# Patient Record
Sex: Female | Born: 1960 | Race: White | Hispanic: No | Marital: Married | Smoking: Never smoker
Health system: Southern US, Community
[De-identification: ages and names within clinical notes are randomized; demographics above are authoritative.]

## PROBLEM LIST (undated history)

## (undated) DIAGNOSIS — Z9889 Other specified postprocedural states: Secondary | ICD-10-CM

## (undated) DIAGNOSIS — Z87442 Personal history of urinary calculi: Secondary | ICD-10-CM

## (undated) DIAGNOSIS — N3010 Interstitial cystitis (chronic) without hematuria: Secondary | ICD-10-CM

## (undated) DIAGNOSIS — M1990 Unspecified osteoarthritis, unspecified site: Secondary | ICD-10-CM

## (undated) DIAGNOSIS — N189 Chronic kidney disease, unspecified: Secondary | ICD-10-CM

## (undated) DIAGNOSIS — R112 Nausea with vomiting, unspecified: Secondary | ICD-10-CM

## (undated) DIAGNOSIS — K219 Gastro-esophageal reflux disease without esophagitis: Secondary | ICD-10-CM

## (undated) HISTORY — PX: OTHER SURGICAL HISTORY: SHX169

## (undated) HISTORY — PX: ABDOMINAL HYSTERECTOMY: SHX81

## (undated) HISTORY — PX: BACK SURGERY: SHX140

---

## 1998-01-30 ENCOUNTER — Inpatient Hospital Stay (HOSPITAL_COMMUNITY): Admission: AD | Admit: 1998-01-30 | Discharge: 1998-01-30 | Payer: Self-pay | Admitting: Obstetrics and Gynecology

## 1998-04-05 ENCOUNTER — Inpatient Hospital Stay (HOSPITAL_COMMUNITY): Admission: AD | Admit: 1998-04-05 | Discharge: 1998-04-05 | Payer: Self-pay | Admitting: Obstetrics and Gynecology

## 1998-04-13 ENCOUNTER — Inpatient Hospital Stay (HOSPITAL_COMMUNITY): Admission: AD | Admit: 1998-04-13 | Discharge: 1998-04-13 | Payer: Self-pay | Admitting: Obstetrics & Gynecology

## 1998-04-18 ENCOUNTER — Inpatient Hospital Stay (HOSPITAL_COMMUNITY): Admission: AD | Admit: 1998-04-18 | Discharge: 1998-04-18 | Payer: Self-pay | Admitting: Obstetrics and Gynecology

## 1998-04-25 ENCOUNTER — Encounter: Payer: Self-pay | Admitting: Obstetrics and Gynecology

## 1998-04-25 ENCOUNTER — Inpatient Hospital Stay (HOSPITAL_COMMUNITY): Admission: AD | Admit: 1998-04-25 | Discharge: 1998-04-27 | Payer: Self-pay | Admitting: Obstetrics and Gynecology

## 1998-04-29 ENCOUNTER — Encounter (HOSPITAL_COMMUNITY): Admission: RE | Admit: 1998-04-29 | Discharge: 1998-07-28 | Payer: Self-pay | Admitting: Obstetrics & Gynecology

## 1998-06-25 ENCOUNTER — Other Ambulatory Visit: Admission: RE | Admit: 1998-06-25 | Discharge: 1998-06-25 | Payer: Self-pay | Admitting: Obstetrics & Gynecology

## 1999-12-22 ENCOUNTER — Other Ambulatory Visit: Admission: RE | Admit: 1999-12-22 | Discharge: 1999-12-22 | Payer: Self-pay | Admitting: Obstetrics & Gynecology

## 2001-01-16 ENCOUNTER — Other Ambulatory Visit: Admission: RE | Admit: 2001-01-16 | Discharge: 2001-01-16 | Payer: Self-pay | Admitting: Obstetrics & Gynecology

## 2002-08-20 ENCOUNTER — Other Ambulatory Visit: Admission: RE | Admit: 2002-08-20 | Discharge: 2002-08-20 | Payer: Self-pay | Admitting: Obstetrics & Gynecology

## 2004-05-15 ENCOUNTER — Emergency Department (HOSPITAL_COMMUNITY): Admission: EM | Admit: 2004-05-15 | Discharge: 2004-05-15 | Payer: Self-pay | Admitting: Family Medicine

## 2004-05-21 ENCOUNTER — Other Ambulatory Visit: Admission: RE | Admit: 2004-05-21 | Discharge: 2004-05-21 | Payer: Self-pay | Admitting: Obstetrics & Gynecology

## 2004-06-12 ENCOUNTER — Emergency Department (HOSPITAL_COMMUNITY): Admission: EM | Admit: 2004-06-12 | Discharge: 2004-06-12 | Payer: Self-pay | Admitting: Emergency Medicine

## 2004-06-15 ENCOUNTER — Emergency Department (HOSPITAL_COMMUNITY): Admission: EM | Admit: 2004-06-15 | Discharge: 2004-06-15 | Payer: Self-pay | Admitting: Emergency Medicine

## 2004-06-15 ENCOUNTER — Ambulatory Visit (HOSPITAL_BASED_OUTPATIENT_CLINIC_OR_DEPARTMENT_OTHER): Admission: RE | Admit: 2004-06-15 | Discharge: 2004-06-15 | Payer: Self-pay | Admitting: Urology

## 2004-06-21 ENCOUNTER — Emergency Department (HOSPITAL_COMMUNITY): Admission: EM | Admit: 2004-06-21 | Discharge: 2004-06-21 | Payer: Self-pay | Admitting: Family Medicine

## 2005-03-05 ENCOUNTER — Ambulatory Visit (HOSPITAL_COMMUNITY): Admission: RE | Admit: 2005-03-05 | Discharge: 2005-03-05 | Payer: Self-pay | Admitting: Obstetrics & Gynecology

## 2005-04-10 ENCOUNTER — Emergency Department (HOSPITAL_COMMUNITY): Admission: EM | Admit: 2005-04-10 | Discharge: 2005-04-11 | Payer: Self-pay | Admitting: Emergency Medicine

## 2005-05-20 ENCOUNTER — Encounter: Admission: RE | Admit: 2005-05-20 | Discharge: 2005-07-01 | Payer: Self-pay | Admitting: Orthopaedic Surgery

## 2005-07-20 ENCOUNTER — Encounter: Admission: RE | Admit: 2005-07-20 | Discharge: 2005-07-20 | Payer: Self-pay | Admitting: Emergency Medicine

## 2005-07-22 ENCOUNTER — Encounter: Admission: RE | Admit: 2005-07-22 | Discharge: 2005-10-20 | Payer: Self-pay | Admitting: *Deleted

## 2005-11-16 ENCOUNTER — Encounter: Admission: RE | Admit: 2005-11-16 | Discharge: 2005-11-23 | Payer: Self-pay | Admitting: *Deleted

## 2005-12-23 ENCOUNTER — Ambulatory Visit (HOSPITAL_COMMUNITY): Admission: RE | Admit: 2005-12-23 | Discharge: 2005-12-23 | Payer: Self-pay | Admitting: Obstetrics and Gynecology

## 2007-07-16 ENCOUNTER — Emergency Department (HOSPITAL_COMMUNITY): Admission: EM | Admit: 2007-07-16 | Discharge: 2007-07-16 | Payer: Self-pay | Admitting: Family Medicine

## 2007-11-20 ENCOUNTER — Encounter (INDEPENDENT_AMBULATORY_CARE_PROVIDER_SITE_OTHER): Payer: Self-pay | Admitting: Obstetrics & Gynecology

## 2007-11-20 ENCOUNTER — Ambulatory Visit (HOSPITAL_COMMUNITY): Admission: RE | Admit: 2007-11-20 | Discharge: 2007-11-21 | Payer: Self-pay | Admitting: Obstetrics & Gynecology

## 2008-09-06 ENCOUNTER — Emergency Department (HOSPITAL_COMMUNITY): Admission: EM | Admit: 2008-09-06 | Discharge: 2008-09-06 | Payer: Self-pay | Admitting: Family Medicine

## 2010-08-04 LAB — POCT URINALYSIS DIP (DEVICE)
Bilirubin Urine: NEGATIVE
Ketones, ur: NEGATIVE mg/dL
Protein, ur: NEGATIVE mg/dL
Specific Gravity, Urine: 1.02 (ref 1.005–1.030)

## 2010-08-04 LAB — URINE CULTURE

## 2010-09-08 NOTE — Op Note (Signed)
[name], [name] NO.:  [id number]   MEDICAL RECORD NO.:  [id number]          PATIENT TYPE:  OIB   LOCATION:  9302                          FACILITY:  [facility]   PHYSICIAN:  Freddy Finner, M.D.   DATE OF BIRTH:  1960-04-29   DATE OF PROCEDURE:  11/20/2007  DATE OF DISCHARGE:  11/21/2007                               OPERATIVE REPORT   PREOPERATIVE DIAGNOSIS:  Menorrhagia, severe dysmenorrhea, ultrasound  findings consistent with adenomyosis.  Patient refusal of other options  of management, requests for definitive surgery.   POSTOPERATIVE DIAGNOSES:  Menorrhagia, severe dysmenorrhea, ultrasound  findings consistent with adenomyosis.  Patient refusal of other options  of management, requests for definitive surgery.  Enlarged uterus.  Normal tubes and ovaries.  Normal abdominal findings.  Appendix not  visualized.   OPERATIVE PROCEDURE:  Laparoscopically-assisted vaginal hysterectomy.   SURGEON:  Freddy Finner, M.D.   ASSISTANT:  Zelphia Cairo, MD.   ESTIMATED INTRAOPERATIVE BLOOD LOSS:  200 mL.   INTRAOPERATIVE COMPLICATIONS:  None.   DESCRIPTION OF PROCEDURE:  The patient was admitted on the morning of  surgery.  She was given a bolus of Ancef.  She was placed in serial  compression hose for her lower extremities.  She was brought to the  operating room and there prepped and draped in usual fashion using  Hibiclens because of her allergy to Betadine.  Bladder was evacuated  with a Robinson catheter.  Hulka tenaculum was attached to the cervix  under direct visualization.  Sterile drapes were applied.  Two small  incisions were made in the abdomen, one at the umbilicus and one just  above the symphysis.  Through the upper incision an 11-mm bladed  disposable trocar was introduced while elevating the anterior abdominal  wall manually.  Direct inspection revealed adequate placement with no  evidence of injury on entry.  Pneumoperitoneum was allowed to  accumulate  with carbon dioxide gas.  Through a second smaller incision just above  the symphysis, a 5 mm trocar was placed.  Through it a blunt probe was  used for traction and exposure.  Systematic examination of pelvic and  abdominal contents was carried out.  The findings were as noted above.  Findings were also recorded.  Still photographs retained in the office  record.  Using the new Ethicon Tripolar type device, the utero-ovarian  ligament, fallopian tube, round ligament and upper broad ligament on  each side were progressively sealed and sharply divided.  The dissection  was carried down to a level just above the uterine arteries.  Attention  was then turned vaginally.  Posterior weighted vaginal retractor was  placed.  Hulka tenaculum was replaced with Christella Hartigan tenaculum.  Mucosa  posterior to the cervix was tented with an Allis and a colpotomy  incision made sharply with Mayo scissors.  The cervix was circumscribed  with a scalpel to release the mucosa.  The LigaSure system was then used  to seal and divide the uterosacral pedicles and the bladder pillars.  Bladder was further advanced off the cervix and lower segment with blunt  and sharp  dissection and anterior peritoneum entered.  Cardinal ligament  pedicles were sealed and divided using LigaSure.  Vessel pedicles were  sealed and divided using LigaSure.  The pedicle taken above the vessels  on each side allowed complete removal of the uterus after sealing and  sharply dividing these pedicles.  The angles of vagina were anchored to  uterosacrals with mattress sutures of 0 Monocryl.  Posterior peritoneum  was closed and uterosacrals plicated with 0 Monocryl.  Cuff was closed  vertically with figure-of-eights of 0 Monocryl.  Foley catheter was  inserted.  Reinspection laparoscopically was carried out using the  Nezhat irrigation system.  No active bleeding sources were identified  either under reduced intra-abdominal pressure or  irrigation.  The  irrigating solution and blood were aspirated from the abdominal cavity.  The instruments removed.  The skin incisions were closed with  interrupted subcuticular sutures of 3-0 Dexon.  Marcaine 0.25% plain was  injected into the incision sites for postoperative analgesia.  Steri-  Strips were applied to the lower incision.  The patient was awakened,  taken to the recovery room in good condition.      Freddy Finner, M.D.  Electronically Signed     WRN/MEDQ  D:  11/23/2007  T:  11/23/2007  Job:  [id number]

## 2010-09-08 NOTE — H&P (Signed)
[name], WAYMIRE NO.:  [id number]   MEDICAL RECORD NO.:  [id number]           PATIENT TYPE:   LOCATION:                                 FACILITY:   PHYSICIAN:  Freddy Finner, M.D.   DATE OF BIRTH:  Sep 30, 1960   DATE OF ADMISSION:  11/20/2007  DATE OF DISCHARGE:                              HISTORY & PHYSICAL   ADMITTING DIAGNOSES:  1. Uterine enlargement.  2. Ultrasound findings consistent with adenomyosis.  3. Chronic menorrhagia, dysmenorrhea.   Patient is a 50 year old, white, married female, gravida 4, para 4, who  has been having issues with menorrhagia and dysmenorrhea since  approximately 2001.  In addition to this problem, she has been treated  for vulvodynia.  She also has interstitial cystitis.  Her chief  complaint at this time is the presence of very heavy, painful periods.  She had a sonohysterogram in the office recently, again confirming  thickening of the myometrium, most likely secondary to adenomyosis.  There was a little thickening in the lower endometrial canal and a  questionable polyp.  Options of therapy have been discussed with the  patient.  She has elected to proceed with laparoscopically-assisted  vaginal hysterectomy.  She has reviewed a video in the office from the  __________, describing the potential risks, benefits and possible  intraoperative complications, including infection, injury to other  organs and hemorrhage.  She also has been counseled on the possibility  of having to perform an open procedure because of complications of  anyone of these types.  She and her husband have been counseled  exhaustively on the different options, including intrauterine device,  endometrial ablation.  She has chosen to proceed with this procedure and  is admitted at this time for that purpose.   REVIEW OF SYSTEMS:  Her current review of systems is otherwise  unremarkable.   PAST MEDICAL HISTORY:  1. As noted, she has interstitial  cystitis.  For this she takes      Elmiron.  2. She has anxiety and chronic pain syndrome, for which she takes      Neurontin 1300 mg a day.  3. She takes Effexor 150 mg a day.  4. BuSpar 10 mg a day.  5. Claritin on a p.r.n. basis.  6. Bentyl on a p.r.n. basis.   ALLERGIES:  She has no known allergies to medications.   SOCIAL HISTORY:  She does not use cigarettes.  She does not use alcohol.   FAMILY HISTORY:  Noncontributory.   PHYSICAL EXAM:  HEENT:  Grossly within normal limits.  Thyroid gland is  not palpably enlarged.  VITAL SIGNS:  Blood pressure in the office 112/80.  CHEST:  Clear to auscultation throughout.  HEART:  Normal sinus rhythm without murmurs, rubs or gallops.  ABDOMEN:  Soft and nontender, without appreciable organomegaly or  palpable masses.  EXTREMITIES:  Without cyanosis, clubbing or edema.  PELVIC EXAMINATION:  External genitalia, vagina and cervix are normal to  inspection.  Bimanual exam reveals the uterus to be approximately 8-9  week size.  There are no palpable adnexal masses.  The rectum is  palpably normal and confirms the above findings.   Most recent mammogram in April of 2009 was normal and Pap smear,  obtained in September of 2008, was normal.   ASSESSMENT:  Uterine enlargement, suspected adenomyosis, chronic pelvic  pain, dysmenorrhea and menorrhagia.   PLAN:  Laparoscopically-assisted vaginal hysterectomy.  Perioperative  prophylaxis will include IV antibiotics and serial compression devices  for the lower extremities during the operative procedure.      Freddy Finner, M.D.  Electronically Signed     WRN/MEDQ  D:  11/17/2007  T:  11/17/2007  Job:  [id number]

## 2010-09-11 NOTE — Discharge Summary (Signed)
[name], [name] NO.:  [id number]   MEDICAL RECORD NO.:  [id number]          PATIENT TYPE:  OIB   LOCATION:  9302                          FACILITY:  [facility]   PHYSICIAN:  Freddy Finner, M.D.   DATE OF BIRTH:  29-Jun-1960   DATE OF ADMISSION:  11/20/2007  DATE OF DISCHARGE:  11/21/2007                               DISCHARGE SUMMARY   DISCHARGE DIAGNOSES:  Clinical diagnoses are severe dysmenorrhea and  menorrhagia.  Ultrasound findings consistent with adenomyosis.  A  histologic finding of uterine leiomyoma and uniform thickening of  myometrium to 3 cm with uterine weight of 199 g.  No histologic  confirmation of adenomyosis.   OPERATIVE PROCEDURE:  Laparoscopic-assisted vaginal hysterectomy.   INTRAOPERATIVE AND POSTOPERATIVE COMPLICATIONS:  None.   DISPOSITION:  The patient was in satisfactory improved condition at the  time of her discharge.  She is to have progressively increasing physical  activity, but no vaginal entry, or heavy lifting.  She is to call for  fever, severe pain, or heavy bleeding.  She is to return to the office  in 2 weeks for first postoperative visit.  She was given Percocet 5/325  to be taken as needed for postoperative pain and Urelle 400 mg q.i.d. as  needed for bladder discomfort.  The patient has known secondary  diagnosis of interstitial cystitis.   Details of the present illness, past history, family history, review of  systems, and physical exam recorded in the admission note.  Briefly, the  patient's exam was remarkable for enlargement of the uterus by clinical  exam and ultrasound, and for her clinical symptoms.   Laboratory data during this admission includes a normal CBC on admission  with hemoglobin of 12.1.  Postoperative hemoglobin was 9.9.  Admission  prothrombin time and PTT were normal.  Urinalysis on admission was  normal.   HOSPITAL COURSE:  The patient was admitted on the morning of surgery.  She was treated  perioperatively with an IV antibiotic and with PAS  compression hose to the lower extremities.  The above-described  procedure was accomplished without difficulty.  There were no  intraoperative complications.  By the first postoperative day, the  patient remained afebrile.  Her vital signs were stable.  She was  tolerating regular diet.  She was having adequate bowel and bladder  function.  She was discharged home with disposition as noted above.      Freddy Finner, M.D.  Electronically Signed     WRN/MEDQ  D:  12/08/2007  T:  12/09/2007  Job:  [id number]

## 2010-09-11 NOTE — Op Note (Signed)
NAMEMONSERATH, NEFF NO.:  [id number]   MEDICAL RECORD NO.:  [id number]          PATIENT TYPE:  AMB   LOCATION:  [facility]                         FACILITY:  [facility]   PHYSICIAN:  Lindaann Slough, M.D.  DATE OF BIRTH:  11-07-1960   DATE OF PROCEDURE:  06/15/2004  DATE OF DISCHARGE:                                 OPERATIVE REPORT   PREOPERATIVE DIAGNOSIS:  Left ureteral calculus with hydronephrosis.   POSTOPERATIVE DIAGNOSIS:  Left ureteral calculus with hydronephrosis.   PROCEDURE:  Cystoscopy, left retrograde pyelogram, ureteroscopy with stone  extraction and insertion of double-J catheter.   SURGEON:  Danae Chen, M.D.   ANESTHESIA:  General.   INDICATIONS:  Patient is a 50 year old who had been treated for urinary  tract infection off and on since November, 2005.  She has been complaining  of pelvic pain and frequency.  About a month ago, she started having left  flank pain and occasional left lower quadrant pain.  Three days ago, she had  severe pain and has been taking Tylenol and ibuprofen for pain and  antibiotics for infection.  She was seen in the emergency room and sent home  on analgesics.  Last night, she started having severe pain and returned to  the emergency room.  A CT scan of the abdomen and pelvis showed a 4 mm  calculus in the left distal ureter with hydronephrosis.  She is now  scheduled for cystoscopy and stone manipulation.   Under general anesthesia, the patient was prepped and draped and placed in  the dorsal lithotomy position.  A #22 Wappler cystoscope was inserted into  the bladder.  The bladder mucosa is normal.  There is no stone or tumor in  the bladder.  The ureteral orifices are in normal position and shape.  A  cone-tipped catheter was then passed through the cystoscope into the left  ureteral orifice.  Contrast was then injected through the cone-tipped  catheter.  There is a filling defect in the distal ureter.  consistent  with  the ureteral calculus.  The cone-tipped catheter was then removed.  A  guidewire was then passed through the cystoscope into the left ureteral  orifice into the ureter all the way up into the renal pelvis.  Then the  cystoscope was removed.  A #6.5 French rigid ureteroscope was then passed  into the bladder and into the ureter.  The stone was visualized in the  distal ureter.  A nitinol stone basket was passed through the ureteroscope,  and the stone was extracted without difficulty.  The ureteroscope was then  passed into the bladder and into the ureter and advanced up to the upper  ureter without difficulty.  There is no evidence of another calculus in the  ureter.  Contrast was then injected  through the ureteroscope, and there is  no evidence of extravasation of contrast.  The ureter is moderately dilated  with dilation of the renal pelvis and the collecting system.  The  ureteroscope was removed.  A #6 26 double-J catheter was then passed over  the guidewire.  The proximal  curl of the double-J catheter is in the renal  pelvis.  The distal curl is in the bladder.   The bladder was then emptied, and the cystoscope and guidewire were removed.  The patient tolerated the procedure well and left the OR in satisfactory  condition to the post anesthesia care unit.      MN/MEDQ  D:  06/15/2004  T:  06/15/2004  Job:  [id number]   cc:   Freddy Finner, M.D.  Fax: [phone]   Oley Balm. Georgina Pillion, M.D.  [address]  [place]  [place] [address]  Fax: [phone]

## 2011-01-18 LAB — POCT URINALYSIS DIP (DEVICE)
Glucose, UA: NEGATIVE
Ketones, ur: NEGATIVE
Nitrite: NEGATIVE
pH: 6

## 2011-01-18 LAB — POCT PREGNANCY, URINE: Preg Test, Ur: NEGATIVE

## 2011-01-18 LAB — URINE CULTURE: Colony Count: 40000

## 2011-01-22 LAB — APTT: aPTT: 28

## 2011-01-22 LAB — URINALYSIS, ROUTINE W REFLEX MICROSCOPIC: Urobilinogen, UA: 0.2

## 2011-01-22 LAB — CBC
HCT: 29.6 — ABNORMAL LOW
HCT: 36.7
Hemoglobin: 12.1
Hemoglobin: 9.9 — ABNORMAL LOW
MCHC: 32.9
MCHC: 33.4
MCV: 90.9
Platelets: 139 — ABNORMAL LOW
RBC: 4.03
RDW: 14.8
RDW: 14.8

## 2017-03-29 ENCOUNTER — Other Ambulatory Visit: Payer: Self-pay | Admitting: Orthopedic Surgery

## 2017-03-29 DIAGNOSIS — M4722 Other spondylosis with radiculopathy, cervical region: Secondary | ICD-10-CM

## 2017-04-06 ENCOUNTER — Other Ambulatory Visit: Payer: Self-pay

## 2017-04-07 ENCOUNTER — Other Ambulatory Visit: Payer: Self-pay

## 2017-04-27 ENCOUNTER — Ambulatory Visit
Admission: RE | Admit: 2017-04-27 | Discharge: 2017-04-27 | Disposition: A | Discharge status: Home / Self Care | Payer: BLUE CROSS/BLUE SHIELD | Source: Ambulatory Visit | Attending: Orthopedic Surgery | Admitting: Orthopedic Surgery

## 2017-04-27 DIAGNOSIS — M4722 Other spondylosis with radiculopathy, cervical region: Secondary | ICD-10-CM

## 2018-02-28 ENCOUNTER — Observation Stay (HOSPITAL_COMMUNITY)
Admission: AD | Admit: 2018-02-28 | Discharge: 2018-03-01 | Disposition: A | Discharge status: Home / Self Care | Payer: BLUE CROSS/BLUE SHIELD | Source: Ambulatory Visit | Attending: Neurosurgery | Admitting: Neurosurgery

## 2018-02-28 ENCOUNTER — Encounter (HOSPITAL_COMMUNITY): Payer: Self-pay | Admitting: *Deleted

## 2018-02-28 DIAGNOSIS — R339 Retention of urine, unspecified: Secondary | ICD-10-CM | POA: Diagnosis not present

## 2018-02-28 DIAGNOSIS — N3289 Other specified disorders of bladder: Secondary | ICD-10-CM | POA: Diagnosis not present

## 2018-02-28 DIAGNOSIS — Z981 Arthrodesis status: Secondary | ICD-10-CM | POA: Diagnosis not present

## 2018-02-28 DIAGNOSIS — N3010 Interstitial cystitis (chronic) without hematuria: Secondary | ICD-10-CM | POA: Diagnosis not present

## 2018-02-28 DIAGNOSIS — F419 Anxiety disorder, unspecified: Secondary | ICD-10-CM | POA: Insufficient documentation

## 2018-02-28 HISTORY — DX: Unspecified osteoarthritis, unspecified site: M19.90

## 2018-02-28 HISTORY — DX: Chronic kidney disease, unspecified: N18.9

## 2018-02-28 LAB — URINALYSIS, ROUTINE W REFLEX MICROSCOPIC
BILIRUBIN URINE: NEGATIVE
Glucose, UA: NEGATIVE mg/dL
HGB URINE DIPSTICK: NEGATIVE
Ketones, ur: NEGATIVE mg/dL
Leukocytes, UA: NEGATIVE
NITRITE: NEGATIVE
PROTEIN: NEGATIVE mg/dL
Specific Gravity, Urine: 1.005 (ref 1.005–1.030)
pH: 8 (ref 5.0–8.0)

## 2018-02-28 MED ORDER — CYCLOBENZAPRINE HCL 5 MG PO TABS
5.0000 mg | ORAL_TABLET | Freq: Three times a day (TID) | ORAL | Status: DC | PRN
  Administered 2018-02-28 – 2018-03-01 (×2): 10 mg via ORAL
  Filled 2018-02-28 (×2): qty 2

## 2018-02-28 MED ORDER — SODIUM CHLORIDE 0.9% FLUSH: 3.0000 mL | INTRAVENOUS | Status: DC | PRN

## 2018-02-28 MED ORDER — DIPHENHYDRAMINE HCL 25 MG PO CAPS: 25.0000 mg | ORAL_CAPSULE | ORAL | Status: DC | PRN

## 2018-02-28 MED ORDER — OXYCODONE HCL 5 MG PO TABS
5.0000 mg | ORAL_TABLET | ORAL | Status: DC | PRN
  Administered 2018-02-28: 5 mg via ORAL
  Filled 2018-02-28: qty 1

## 2018-02-28 MED ORDER — DOCUSATE SODIUM 100 MG PO CAPS
100.0000 mg | ORAL_CAPSULE | Freq: Two times a day (BID) | ORAL | Status: DC
  Administered 2018-02-28 – 2018-03-01 (×2): 100 mg via ORAL
  Filled 2018-02-28 (×2): qty 1

## 2018-02-28 MED ORDER — ACETAMINOPHEN 650 MG RE SUPP: 650.0000 mg | Freq: Four times a day (QID) | RECTAL | Status: DC | PRN

## 2018-02-28 MED ORDER — ACETAMINOPHEN 325 MG PO TABS: 650.0000 mg | ORAL_TABLET | Freq: Four times a day (QID) | ORAL | Status: DC | PRN

## 2018-02-28 MED ORDER — SODIUM CHLORIDE 0.9 % IV SOLN: 250.0000 mL | INTRAVENOUS | Status: DC | PRN

## 2018-02-28 MED ORDER — PHENAZOPYRIDINE HCL 100 MG PO TABS
100.0000 mg | ORAL_TABLET | Freq: Three times a day (TID) | ORAL | Status: DC
  Administered 2018-02-28: 100 mg via ORAL
  Filled 2018-02-28 (×4): qty 1

## 2018-02-28 MED ORDER — SODIUM CHLORIDE 0.9% FLUSH
3.0000 mL | Freq: Two times a day (BID) | INTRAVENOUS | Status: DC
  Administered 2018-02-28: 3 mL via INTRAVENOUS

## 2018-02-28 MED ORDER — ZOLPIDEM TARTRATE 5 MG PO TABS: 5.0000 mg | ORAL_TABLET | Freq: Every evening | ORAL | Status: DC | PRN

## 2018-02-28 NOTE — H&P (Signed)
Subjective:  the patient is a 57 year old white female on whom I performed a C3-4, C4-5 and C5-6 anterior cervical diskectomy, fusion and plating at the [facility] on 02/27/2018.  The surgery went well.  The patient was observed overnight.  She has a history of interstitial cystitis and had some urinary retention.  A Foley catheter was placed.  We removed this this morning but the patient has had continued urinary retention.  She is transferred to [facility] for further observation and management.   Past Medical History: Diagnosis Date . Arthritis  . Chronic kidney disease    Past Surgical History: Procedure Laterality Date . ABDOMINAL HYSTERECTOMY     Allergies not on file  Social History  Tobacco Use . Smoking status: Never Smoker . Smokeless tobacco: Never Used Substance Use Topics . Alcohol use: Never   Frequency: Never   History reviewed. No pertinent family history. Prior to Admission medications  Not on File    Review of Systems  Positive ROS:   As above  All other systems have been reviewed and were otherwise negative with the exception of those mentioned in the HPI and as above.  Objective: Vital signs in last 24 hours: Temp:  [98 F (36.7 C)] 98 F (36.7 C) (11/05 1557) Pulse Rate:  [72] 72 (11/05 1557) Resp:  [18] 18 (11/05 1557) BP: (157)/(84) 157/84 (11/05 1557) SpO2:  [100 %] 100 % (11/05 1557) There is no height or weight on file to calculate BMI.    physical exam:   General:  An alert and pleasant  57 year old white female in no apparent distress  HEENT:  Normocephalic, atraumatic  Neck:  The patient's left anterior cervical dressing is clean and dry.  There is no hematoma or shift.    Thorax: Symmetric  Abdomen: Unremarkable  Extremities:  Unremarkable  Neurologic exam:  The patient is alert and oriented x3.  The patient's strength is grossly normal in all 4 extremities.   Data Review No results found  for: WBC, HGB, HCT, MCV, PLT No results found for: NA, K, CL, CO2, BUN, CREATININE, GLUCOSE No results found for: INR, PROTIME  Assessment/Plan:   Urinary retention, interstitial cystitis: I have discussed the situation with the patient.  We will admit her for observation and plan to discontinue her Foley catheter tomorrow.  If her urinary retention continues we may ask her urologist, Dr. Sim Boast, to see the patient.   Cristi Loron 02/28/2018 5:14 PM

## 2018-03-01 DIAGNOSIS — R339 Retention of urine, unspecified: Secondary | ICD-10-CM | POA: Diagnosis not present

## 2018-03-01 LAB — URINE CULTURE: CULTURE: NO GROWTH

## 2018-03-01 MED ORDER — DOCUSATE SODIUM 100 MG PO CAPS: 100.0000 mg | ORAL_CAPSULE | Freq: Two times a day (BID) | ORAL | 0 refills | Status: DC

## 2018-03-01 MED ORDER — DIAZEPAM 5 MG PO TABS: 5.0000 mg | ORAL_TABLET | Freq: Three times a day (TID) | ORAL | 0 refills | Status: DC | PRN

## 2018-03-01 MED ORDER — DIAZEPAM 5 MG PO TABS
10.0000 mg | ORAL_TABLET | Freq: Once | ORAL | Status: AC
  Administered 2018-03-01: 10 mg via ORAL
  Filled 2018-03-01: qty 2

## 2018-03-01 MED ORDER — OXYCODONE HCL 5 MG PO TABS: 5.0000 mg | ORAL_TABLET | ORAL | 0 refills | Status: DC | PRN

## 2018-03-01 MED ORDER — DIAZEPAM 5 MG PO TABS: 5.0000 mg | ORAL_TABLET | Freq: Four times a day (QID) | ORAL | Status: DC | PRN

## 2018-03-01 NOTE — Discharge Summary (Signed)
Physician Discharge Summary  Patient ID: [name] MRN: [id number] DOB/AGE: [birth year] 57 y.o.  Admit date: 02/28/2018 Discharge date: 03/01/2018  Admission Diagnoses: Urinary retention, interstitial cystitis, bladder spasms  Discharge Diagnoses: The same Active Problems:   Urinary retention with incomplete bladder emptying   Discharged Condition: good  Hospital Course: I performed a 3 level anterior cervical discectomy, fusion on the patient on 02/27/2018 at the [facility] surgery center.  She was observed overnight.  She has history of interstitial cystitis and had urinary retention and bladder spasms which did not resolve.  She was transferred to [facility] for observation on 02/28/2018.  She has some spasm throughout the course of the evening.  These responded only to p.o. Valium.  On 03/01/2018 the patient, and her husband, requested discharge to home.  They were given written and oral discharge instructions.  All their questions were answered.  We discussed the risks of overdose if she takes opioids and benzodiazepines other at that as directed.  Consults: None Significant Diagnostic Studies: None Treatments: Observation Discharge Exam: Blood pressure 135/84, pulse 84, temperature 98.3 F (36.8 C), temperature source Oral, resp. rate 19, SpO2 95 %. The patient is alert and pleasant.  Her dressing is clean and dry.  There is no hematoma or shift.  Her strength is normal.  Disposition: Home  Discharge Instructions    Call MD for:  difficulty breathing, headache or visual disturbances   Complete by:  As directed    Call MD for:  extreme fatigue   Complete by:  As directed    Call MD for:  hives   Complete by:  As directed    Call MD for:  persistant dizziness or light-headedness   Complete by:  As directed    Call MD for:  persistant nausea and vomiting   Complete by:  As directed    Call MD for:  redness, tenderness, or signs of infection (pain,  swelling, redness, odor or green/yellow discharge around incision site)   Complete by:  As directed    Call MD for:  severe uncontrolled pain   Complete by:  As directed    Call MD for:  temperature >100.4   Complete by:  As directed    Diet - low sodium heart healthy   Complete by:  As directed    Discharge instructions   Complete by:  As directed    Call [phone] for a followup appointment. Take a stool softener while you are using pain medications.   Driving Restrictions   Complete by:  As directed    Do not drive for 2 weeks.   Increase activity slowly   Complete by:  As directed    Lifting restrictions   Complete by:  As directed    Do not lift more than 5 pounds. No excessive bending or twisting.   May shower / Bathe   Complete by:  As directed    Remove the dressing for 3 days after surgery.  You may shower, but leave the incision alone.   Remove dressing in 24 hours   Complete by:  As directed      Allergies as of 03/01/2018   No Known Allergies     Medication List    TAKE these medications   amitriptyline 25 MG tablet Commonly known as:  ELAVIL Take 12.5 mg by mouth at bedtime.   diazepam 5 MG tablet Commonly known as:  VALIUM Take 1 tablet (5 mg total) by mouth every  8 (eight) hours as needed for muscle spasms.   docusate sodium 100 MG capsule Commonly known as:  COLACE Take 1 capsule (100 mg total) by mouth 2 (two) times daily.   gabapentin 100 MG capsule Commonly known as:  NEURONTIN Take 100 mg by mouth at bedtime.   oxyCODONE 5 MG immediate release tablet Commonly known as:  Oxy IR/ROXICODONE Take 1 tablet (5 mg total) by mouth every 4 (four) hours as needed for moderate pain.   pentosan polysulfate 100 MG capsule Commonly known as:  ELMIRON Take 100 mg by mouth 3 (three) times daily.   venlafaxine XR 150 MG 24 hr capsule Commonly known as:  EFFEXOR-XR Take 150 mg by mouth daily with breakfast.        Signed: Cristi Loron 03/01/2018, 1:32 PM

## 2018-03-01 NOTE — Progress Notes (Signed)
Patient continue to c/o of burning, pain and bladder spasm. Patient very restless. Per patient, it's the worst bladder pain and spasm she had, wanting to see urologist tonight. Sitz bath done w  Very slight relief. BP elevated 163/92. Spouse at bedside very concerned. On call MD Dr Danielle Dess notified and updated pts condition.  Dr Danielle Dess, called urologist Dr Berneice Heinrich (on call). New order received: Valium 10 mg po x 1 now. Patient informed of the plan and verbalized understanding. Will cont to monitor.   02/28/18 2331  Vitals  Temp 98.3 F (36.8 C)  Temp Source Oral  BP (!) 163/92  MAP (mmHg) 111  BP Location Right Arm  BP Method Automatic  Patient Position (if appropriate) Lying  Pulse Rate 79  Resp 18  Oxygen Therapy  SpO2 99 %  O2 Device Room Air

## 2018-03-01 NOTE — Progress Notes (Signed)
Patient ID: [name], female   DOB: October 16, 1960, 57 y.o.   MRN: [id number] Contacted regarding patients severe bladder pain.UA negative and patient requested sitz bath and pain being intolerable despite earlier pyridium. Husband requesting Urology consult. I spoke with Dr. Berneice Heinrich, Urologist on call who suggested only symptomatic care with routine meds and no specific intervention. I have given nurse an order for Valium 10 mg po to help ease anxiety and hopefully pain.

## 2018-03-01 NOTE — Progress Notes (Signed)
Pt doing well. Pt and husband given D/C instructions with Rx's, verbal understanding was provided. Pt's incision is clean and dry with no sign of infection. Pt's IV was removed prior to D/C. Pt D/C'd home via wheelchair @ 1400 per MD order. Pt is stable @ D/C and has no other needs at this time. Rema Fendt, RN

## 2018-08-26 ENCOUNTER — Inpatient Hospital Stay (HOSPITAL_COMMUNITY)
Admission: EM | Admit: 2018-08-26 | Discharge: 2018-08-31 | DRG: 200 | Disposition: A | Discharge status: Home / Self Care | Payer: BLUE CROSS/BLUE SHIELD | Attending: General Surgery | Admitting: General Surgery

## 2018-08-26 ENCOUNTER — Emergency Department (HOSPITAL_COMMUNITY): Payer: BLUE CROSS/BLUE SHIELD

## 2018-08-26 ENCOUNTER — Encounter (HOSPITAL_COMMUNITY): Payer: Self-pay | Admitting: Emergency Medicine

## 2018-08-26 DIAGNOSIS — J9383 Other pneumothorax: Principal | ICD-10-CM

## 2018-08-26 DIAGNOSIS — S2241XA Multiple fractures of ribs, right side, initial encounter for closed fracture: Secondary | ICD-10-CM

## 2018-08-26 DIAGNOSIS — Z9689 Presence of other specified functional implants: Secondary | ICD-10-CM

## 2018-08-26 DIAGNOSIS — I9589 Other hypotension: Secondary | ICD-10-CM

## 2018-08-26 DIAGNOSIS — S270XXA Traumatic pneumothorax, initial encounter: Secondary | ICD-10-CM

## 2018-08-26 DIAGNOSIS — J939 Pneumothorax, unspecified: Secondary | ICD-10-CM

## 2018-08-26 DIAGNOSIS — I809 Phlebitis and thrombophlebitis of unspecified site: Secondary | ICD-10-CM | POA: Diagnosis present

## 2018-08-26 DIAGNOSIS — Y92018 Other place in single-family (private) house as the place of occurrence of the external cause: Secondary | ICD-10-CM | POA: Diagnosis not present

## 2018-08-26 DIAGNOSIS — R0781 Pleurodynia: Secondary | ICD-10-CM | POA: Diagnosis not present

## 2018-08-26 DIAGNOSIS — M62838 Other muscle spasm: Secondary | ICD-10-CM | POA: Diagnosis present

## 2018-08-26 DIAGNOSIS — Z1159 Encounter for screening for other viral diseases: Secondary | ICD-10-CM | POA: Diagnosis present

## 2018-08-26 DIAGNOSIS — W1789XA Other fall from one level to another, initial encounter: Secondary | ICD-10-CM | POA: Diagnosis present

## 2018-08-26 HISTORY — DX: Other specified postprocedural states: Z98.890

## 2018-08-26 HISTORY — DX: Interstitial cystitis (chronic) without hematuria: N30.10

## 2018-08-26 LAB — CBC
HCT: 46.6 % — ABNORMAL HIGH (ref 36.0–46.0)
Hemoglobin: 14.6 g/dL (ref 12.0–15.0)
MCH: 29.9 pg (ref 26.0–34.0)
MCHC: 31.3 g/dL (ref 30.0–36.0)
MCV: 95.5 fL (ref 80.0–100.0)
Platelets: 242 10*3/uL (ref 150–400)
RBC: 4.88 MIL/uL (ref 3.87–5.11)
RDW: 12.7 % (ref 11.5–15.5)
WBC: 7 10*3/uL (ref 4.0–10.5)
nRBC: 0 % (ref 0.0–0.2)

## 2018-08-26 LAB — COMPREHENSIVE METABOLIC PANEL
ALT: 16 U/L (ref 0–44)
AST: 23 U/L (ref 15–41)
Albumin: 4 g/dL (ref 3.5–5.0)
Alkaline Phosphatase: 86 U/L (ref 38–126)
Anion gap: 11 (ref 5–15)
BUN: 12 mg/dL (ref 6–20)
CO2: 24 mmol/L (ref 22–32)
Calcium: 9.6 mg/dL (ref 8.9–10.3)
Chloride: 105 mmol/L (ref 98–111)
Creatinine, Ser: 1.3 mg/dL — ABNORMAL HIGH (ref 0.44–1.00)
GFR calc Af Amer: 53 mL/min — ABNORMAL LOW (ref >60)
GFR calc non Af Amer: 46 mL/min — ABNORMAL LOW (ref >60)
Glucose, Bld: 153 mg/dL — ABNORMAL HIGH (ref 70–99)
Potassium: 4.2 mmol/L (ref 3.5–5.1)
Sodium: 140 mmol/L (ref 135–145)
Total Bilirubin: 0.4 mg/dL (ref 0.3–1.2)
Total Protein: 7.1 g/dL (ref 6.5–8.1)

## 2018-08-26 LAB — BPAM FFP
Blood Product Expiration Date: 202005052359
Blood Product Expiration Date: 202005052359
ISSUE DATE / TIME: 202005021504
ISSUE DATE / TIME: 202005021504
Unit Type and Rh: 6200
Unit Type and Rh: 6200

## 2018-08-26 LAB — PREPARE FRESH FROZEN PLASMA: Unit division: 0

## 2018-08-26 LAB — CDS SEROLOGY

## 2018-08-26 LAB — PROTIME-INR
INR: 1 (ref 0.8–1.2)
Prothrombin Time: 12.7 seconds (ref 11.4–15.2)

## 2018-08-26 LAB — ABO/RH: ABO/RH(D): O NEG

## 2018-08-26 LAB — LACTIC ACID, PLASMA: Lactic Acid, Venous: 2.6 mmol/L (ref 0.5–1.9)

## 2018-08-26 LAB — SARS CORONAVIRUS 2 BY RT PCR (HOSPITAL ORDER, PERFORMED IN ~~LOC~~ HOSPITAL LAB): SARS Coronavirus 2: NEGATIVE

## 2018-08-26 LAB — ETHANOL: Alcohol, Ethyl (B): <10 mg/dL (ref <10)

## 2018-08-26 MED ORDER — IBUPROFEN 200 MG PO TABS
600.0000 mg | ORAL_TABLET | Freq: Four times a day (QID) | ORAL | Status: DC | PRN
  Administered 2018-08-27 – 2018-08-28 (×4): 600 mg via ORAL
  Filled 2018-08-26 (×4): qty 3

## 2018-08-26 MED ORDER — LACTATED RINGERS IV SOLN
INTRAVENOUS | Status: DC
  Administered 2018-08-26 – 2018-08-28 (×3): via INTRAVENOUS

## 2018-08-26 MED ORDER — HEPARIN SODIUM (PORCINE) 5000 UNIT/ML IJ SOLN
5000.0000 [IU] | Freq: Three times a day (TID) | INTRAMUSCULAR | Status: DC
  Administered 2018-08-27 – 2018-08-31 (×13): 5000 [IU] via SUBCUTANEOUS
  Filled 2018-08-26 (×15): qty 1

## 2018-08-26 MED ORDER — ACETAMINOPHEN 325 MG PO TABS
650.0000 mg | ORAL_TABLET | Freq: Four times a day (QID) | ORAL | Status: DC
  Administered 2018-08-26 – 2018-08-28 (×6): 650 mg via ORAL
  Filled 2018-08-26 (×6): qty 2

## 2018-08-26 MED ORDER — IOHEXOL 300 MG/ML  SOLN
100.0000 mL | Freq: Once | INTRAMUSCULAR | Status: AC | PRN
  Administered 2018-08-26: 100 mL via INTRAVENOUS

## 2018-08-26 MED ORDER — ESTROGENS, CONJUGATED 0.625 MG/GM VA CREA
1.0000 | TOPICAL_CREAM | VAGINAL | Status: DC
  Filled 2018-08-26: qty 30

## 2018-08-26 MED ORDER — OXYCODONE HCL 5 MG PO TABS
5.0000 mg | ORAL_TABLET | Freq: Four times a day (QID) | ORAL | Status: DC | PRN
  Administered 2018-08-26: 10 mg via ORAL
  Filled 2018-08-26: qty 2

## 2018-08-26 MED ORDER — FENTANYL CITRATE (PF) 100 MCG/2ML IJ SOLN
50.0000 ug | Freq: Once | INTRAMUSCULAR | Status: AC
  Administered 2018-08-26: 50 ug via INTRAVENOUS
  Filled 2018-08-26: qty 2

## 2018-08-26 MED ORDER — PENTOSAN POLYSULFATE SODIUM 100 MG PO CAPS
100.0000 mg | ORAL_CAPSULE | Freq: Two times a day (BID) | ORAL | Status: DC
  Administered 2018-08-26 – 2018-08-31 (×10): 100 mg via ORAL
  Filled 2018-08-26 (×11): qty 1

## 2018-08-26 MED ORDER — VENLAFAXINE HCL ER 75 MG PO CP24
150.0000 mg | ORAL_CAPSULE | Freq: Every day | ORAL | Status: DC
  Administered 2018-08-26 – 2018-08-30 (×5): 150 mg via ORAL
  Filled 2018-08-26 (×5): qty 2

## 2018-08-26 MED ORDER — HYDROMORPHONE HCL 1 MG/ML IJ SOLN
0.5000 mg | INTRAMUSCULAR | Status: DC | PRN
  Administered 2018-08-26 – 2018-08-29 (×10): 0.5 mg via INTRAVENOUS
  Filled 2018-08-26 (×10): qty 1

## 2018-08-26 MED ORDER — SODIUM CHLORIDE 0.9 % IV SOLN
INTRAVENOUS | Status: AC | PRN
  Administered 2018-08-26: 1000 mL via INTRAVENOUS

## 2018-08-26 MED ORDER — ONDANSETRON HCL 4 MG/2ML IJ SOLN
4.0000 mg | Freq: Once | INTRAMUSCULAR | Status: AC
  Administered 2018-08-26: 4 mg via INTRAVENOUS
  Filled 2018-08-26: qty 2

## 2018-08-26 MED ORDER — ONDANSETRON 4 MG PO TBDP: 4.0000 mg | ORAL_TABLET | Freq: Four times a day (QID) | ORAL | Status: DC | PRN

## 2018-08-26 MED ORDER — GABAPENTIN 300 MG PO CAPS
300.0000 mg | ORAL_CAPSULE | Freq: Two times a day (BID) | ORAL | Status: DC
  Administered 2018-08-27 – 2018-08-31 (×10): 300 mg via ORAL
  Filled 2018-08-26 (×10): qty 1

## 2018-08-26 MED ORDER — HYDROMORPHONE HCL 1 MG/ML IJ SOLN
1.0000 mg | Freq: Once | INTRAMUSCULAR | Status: AC
  Administered 2018-08-26: 1 mg via INTRAVENOUS
  Filled 2018-08-26: qty 1

## 2018-08-26 MED ORDER — AMITRIPTYLINE HCL 25 MG PO TABS
50.0000 mg | ORAL_TABLET | Freq: Every day | ORAL | Status: DC
  Administered 2018-08-26 – 2018-08-27 (×2): 50 mg via ORAL
  Administered 2018-08-28 – 2018-08-29 (×2): 100 mg via ORAL
  Administered 2018-08-30: 50 mg via ORAL
  Filled 2018-08-26: qty 4
  Filled 2018-08-26 (×2): qty 2
  Filled 2018-08-26 (×2): qty 4

## 2018-08-26 MED ORDER — ONDANSETRON HCL 4 MG/2ML IJ SOLN: 4.0000 mg | Freq: Four times a day (QID) | INTRAMUSCULAR | Status: DC | PRN

## 2018-08-26 MED ORDER — GABAPENTIN 100 MG PO CAPS
100.0000 mg | ORAL_CAPSULE | Freq: Every day | ORAL | Status: DC
  Administered 2018-08-26 – 2018-08-30 (×5): 100 mg via ORAL
  Filled 2018-08-26 (×5): qty 1

## 2018-08-26 MED ORDER — DOCUSATE SODIUM 100 MG PO CAPS
200.0000 mg | ORAL_CAPSULE | Freq: Two times a day (BID) | ORAL | Status: DC
  Administered 2018-08-26 – 2018-08-30 (×8): 200 mg via ORAL
  Filled 2018-08-26 (×9): qty 2

## 2018-08-26 NOTE — ED Notes (Signed)
Pt husband updated

## 2018-08-26 NOTE — Consult Note (Addendum)
Responded to page from ED when pt came in, and was given phone # for husband Sabella Yates as [phone]. Have not been able to reach him on that #, will investigate further. Standing by.  Rev. Donnel Saxon Chaplain  Found that correct telephone # for pt's husband Loraine Leriche is [phone]. Spoke with him. He was then at home, and would be returning soon with pt's cell phone. He'd spoken to her nurse about an hour ago, and appreciated my call. Invited him to let nurse know if he or his wife desired to speak later with chaplain services.  Rev. Donnel Saxon Chaplain

## 2018-08-26 NOTE — Procedures (Signed)
FAST  Pre-procedure diagnosis: Bilateral pneumothorax  Post-procedure diagnosis: Same  Procedure:  1. Placement of right sided 14 Fr pigtail chest tube 2. Placement of left sided 14 Fr pigtail chest tube  Surgeon: Marin Olp, MD  Assistant: ER Resident  Procedure in detail: I discussed the findings on her chest x-ray with her. We discussed bilateral chest tube placement. The planned procedure, material risks (including but not limited to pain, bleeding, infection, persistent pneumothorax, injury to lung, injury to liver, injury to spleen, injury to blood vessels/nerves, empyema, need for additional procedures, pneumonia, heart attack, stroke, death) benefits and alternatives were discussed. Her questions were answered to her satisfaction and she elected to proceed with chest tube placement.  This was an emergency procedure. I supervised the ER resident placing the right sided chest tube. The right chest wall was prepped and draped in the standard sterile fashion. The skin and subcutaneous tissue was infiltrated with local anesthetic. The chest wall at insertion site was also infiltrated with local anesthetic. Following this, an 18G introducer needle was gently advanced at approximately the 5th interspace - in line with inframammary fold. Intrapleural location was confirmed by aspiration of bubbles. Using Seldinger technique, a J-wire was passed down the introducer needle. The needle was removed. The skin was knicked with a knife at this location. The 14 Fr dilator was passed down the wire. The 14 Fr pigtail was then placed down the wire. The wire was removed. The chest tube was connected to the pleuravac and suction at 20 cm H2O. The chest tube was secured with a 2-0 silk suture and chest tube tape.  Attention was turned to the left pneumothorax. The left chest wall was prepped and draped in the standard sterile fashion. The skin and subcutaneous tissue was infiltrated with local anesthetic.  The chest wall at insertion site was also infiltrated with local anesthetic. Following this, an 18G introducer needle was gently advanced at approximately the 5th interspace - in line with inframammary fold. Intrapleural location was confirmed by aspiration of bubbles. Using Seldinger technique, a J-wire was passed down the introducer needle. The needle was removed. The skin was knicked with a knife at this location. The 14 Fr dilator was passed down the wire. The 14 Fr pigtail was then placed down the wire. The wire was removed. The chest tube was connected to the pleuravac and suction at 20 cm H2O. The chest tube was secured with a 2-0 silk suture and chest tube tape.  A post-procedure chest x-ray has been ordered  She tolerated the procedure well and respirations improved following. Her comfort of breathing also improved dramatically.  Stephanie Coup. Cliffton Asters, M.D. [facility]

## 2018-08-26 NOTE — ED Provider Notes (Signed)
[facility] EMERGENCY DEPARTMENT Provider Note   CSN: [id number] Arrival date & time: 08/26/18  1505    History   Chief Complaint No chief complaint on file.   HPI [name] is a 58 y.o. female.     HPI 58 year old woman presents to the emergency department after a fall through a ceiling.  Patient complaining of shortness of breath, bilateral chest pain and noted to have decreased breath sounds on both sides.  The patient has low blood pressure and is a level 1 trauma.  History of interstitial cystitis.  Does not use illicit substances.  Denies pain elsewhere at this time except for her right arm. Past Medical History:  Diagnosis Date  . H/O neck surgery   . Interstitial cystitis     There are no active problems to display for this patient.      OB History   No obstetric history on file.      Home Medications    Prior to Admission medications   Medication Sig Start Date End Date Taking? Authorizing Provider  amitriptyline (ELAVIL) 50 MG tablet Take 50-100 mg by mouth at bedtime. 07/27/18   [provider]  ciprofloxacin (CIPRO) 250 MG tablet Take 250 mg by mouth as needed. After intercourse. 06/22/18   [provider]  ELMIRON 100 MG capsule Take 100 mg by mouth 3 (three) times daily before meals. 07/27/18   [provider]  gabapentin (NEURONTIN) 100 MG capsule Take 100 mg by mouth 3 (three) times daily. 08/23/18   [provider]  gabapentin (NEURONTIN) 300 MG capsule Take 300 mg by mouth 3 (three) times daily. 08/09/18   [provider]  hydrOXYzine (ATARAX/VISTARIL) 25 MG tablet Take 25-50 mg by mouth See admin instructions. One hour before sleep 07/22/18   [provider]  lidocaine (XYLOCAINE) 5 % ointment Apply 1 application topically daily as needed for pain. 07/27/18   [provider]  meloxicam (MOBIC) 15 MG tablet Take 15 mg by mouth daily as needed for pain. 03/31/18   [provider]  PREMARIN vaginal cream Place 1 application vaginally every other day. 07/17/18   [provider]  venlafaxine XR (EFFEXOR-XR) 75 MG 24 hr capsule Take 150 mg by mouth daily. 06/29/18   [provider]    Family History No family history on file.  Social History Social History   Tobacco Use  . Smoking status: Not on file  Substance Use Topics  . Alcohol use: Not on file  . Drug use: Not on file     Allergies   Betadine antibiotic-moisturize [bacitracin-polymyxin b]   Review of Systems Review of Systems  Constitutional: Negative for activity change, appetite change and fever.  HENT: Negative for congestion.   Respiratory: Positive for chest tightness and shortness of breath.   Cardiovascular: Positive for chest pain.  Gastrointestinal: Negative for abdominal pain.  Endocrine: Negative for polyphagia and polyuria.  Musculoskeletal: Positive for arthralgias.  Skin: Negative for rash.  Allergic/Immunologic: Negative for immunocompromised state.  Neurological: Negative for weakness and numbness.  Hematological: Does not bruise/bleed easily.  Psychiatric/Behavioral: Negative for suicidal ideas.     Physical Exam Updated Vital Signs BP 132/76   Pulse 76   Temp 97.8 F (36.6 C) (Tympanic)   Resp 16   Ht  (1.702 m)   Wt 108.9 kg   SpO2 100%   BMI 37.59 kg/m   Physical Exam Vitals signs and nursing note reviewed.  Constitutional:  General: She is in acute distress.     Appearance: She is well-developed. She is ill-appearing.  HENT:     Head: Normocephalic and atraumatic.     Nose: No rhinorrhea.     Mouth/Throat:     Pharynx: No oropharyngeal exudate or posterior oropharyngeal erythema.  Eyes:     Conjunctiva/sclera: Conjunctivae normal.  Neck:     Musculoskeletal: Neck supple.  Cardiovascular:     Rate and Rhythm: Regular rhythm. Tachycardia present.     Heart sounds: No murmur.     Comments: Blood pressure 60  systolic.  Bilateral radial pulses intact but weak.  Crepitus felt in the right chest with multiple areas of abrasion in the posterior lateral right chest. Pulmonary:     Effort: Respiratory distress present.  Abdominal:     Palpations: Abdomen is soft.     Tenderness: There is no abdominal tenderness.  Musculoskeletal:     Right lower leg: No edema.     Left lower leg: No edema.  Skin:    General: Skin is warm and dry.     Findings: Bruising, erythema and lesion present.  Neurological:     General: No focal deficit present.     Mental Status: She is alert and oriented to person, place, and time. Mental status is at baseline.     Cranial Nerves: No cranial nerve deficit.     Sensory: No sensory deficit.     Motor: No weakness.      ED Treatments / Results  Labs (all labs ordered are listed, but only abnormal results are displayed) Labs Reviewed  COMPREHENSIVE METABOLIC PANEL - Abnormal; Notable for the following components:      Result Value   Glucose, Bld 153 (*)    Creatinine, Ser 1.30 (*)    GFR calc non Af Amer 46 (*)    GFR calc Af Amer 53 (*)    All other components within normal limits  CBC - Abnormal; Notable for the following components:   HCT 46.6 (*)    All other components within normal limits  LACTIC ACID, PLASMA - Abnormal; Notable for the following components:   Lactic Acid, Venous 2.6 (*)    All other components within normal limits  CDS SEROLOGY  ETHANOL  PROTIME-INR  URINALYSIS, ROUTINE W REFLEX MICROSCOPIC  PREPARE FRESH FROZEN PLASMA  TYPE AND SCREEN  ABO/RH    EKG None  Radiology Dg Chest Port 1 View  Result Date: 08/26/2018 CLINICAL DATA:  Bilateral chest tube placement EXAM: PORTABLE CHEST 1 VIEW COMPARISON:  Aug 26, 2018 FINDINGS: Bilateral chest tubes have been placed in the interval. The right-sided chest tube terminates over the right lower lobe. The right-sided pneumothorax is smaller now measuring 10 mm at the apex versus 4.9 cm  previously. The left-sided chest tube terminates along the lateral aspect of the apex. No definitive left-sided pneumothorax remains. The cardiomediastinal silhouette is stable. Small bilateral pleural effusions with underlying atelectasis or more prominent in the short interval. No other acute abnormalities. IMPRESSION: 1. Bilateral chest tubes have been placed. A small right apical pneumothorax remains, smaller in the interval. No identified left pneumothorax is identified on today's study. 2. Small bilateral pleural effusions and bibasilar atelectasis are more prominent in the interval. Electronically Signed   By: Gerome Samavid  Williams III M.D   On: 08/26/2018 16:06   Dg Chest Port 1 View  Result Date: 08/26/2018 CLINICAL DATA:  Shortness of breath, fall, fell through the ceiling of a [employer]  [employer] onto back EXAM: PORTABLE CHEST 1 VIEW COMPARISON:  Portable exam 1513 hours without priors for comparison FINDINGS: Normal heart size, mediastinal contours and pulmonary vascularity. BILATERAL moderate-sized pneumothoraces without mediastinal shift. Bibasilar atelectasis. No gross pleural effusion. Minimal LEFT chest wall emphysema. Displaced fracture of the posterolateral LEFT fifth rib. Nondisplaced fracture of the lateral RIGHT eighth rib. IMPRESSION: BILATERAL moderate pneumothoraces and basilar atelectasis without mediastinal shift. BILATERAL rib fractures. Critical Value/emergent results were called by telephone at the time of interpretation on 08/26/2018 at 3:32 pm to Dr. Juleen China, who verbally acknowledged these results. Electronically Signed   By: Ulyses Southward M.D.   On: 08/26/2018 15:33    Procedures .Critical Care Performed by: Ina Kick, MD Authorized by: Ina Kick, MD   Critical care provider statement:    Critical care time (minutes):  45   Critical care was necessary to treat or prevent imminent or life-threatening deterioration of the following conditions:  Circulatory failure,  respiratory failure, shock and trauma   Critical care was time spent personally by me on the following activities:  Blood draw for specimens, development of treatment plan with patient or surrogate, discussions with consultants, evaluation of patient's response to treatment, ordering and performing treatments and interventions, ordering and review of laboratory studies, ordering and review of radiographic studies, pulse oximetry, review of old charts and obtaining history from patient or surrogate   (including critical care time)  Medications Ordered in ED Medications  0.9 %  sodium chloride infusion (1,000 mLs Intravenous New Bag/Given 08/26/18 1508)  iohexol (OMNIPAQUE) 300 MG/ML solution 100 mL (has no administration in time range)  ondansetron (ZOFRAN) injection 4 mg (4 mg Intravenous Given 08/26/18 1558)  fentaNYL (SUBLIMAZE) injection 50 mcg (50 mcg Intravenous Given 08/26/18 1559)     Initial Impression / Assessment and Plan / ED Course  I have reviewed the triage vital signs and the nursing notes.  Pertinent labs & imaging results that were available during my care of the patient were reviewed by me and considered in my medical decision making (see chart for details).       58 year old woman presents to the emergency department as a level 1 trauma for hypotension after a fall as described above.  Bilateral chest tubes placed with trauma attending.  Pending right upper extremity x-ray.  Hypotension immediately improved after venting of both chests.  Patient tolerated the procedures well.  Fentanyl and Zofran given for patient comfort.  CT scans show multiple rib fxs with chest tubes in appropriate place.  Admitted to trauma for further management.  Husband updated. No acute events under my care.   Final Clinical Impressions(s) / ED Diagnoses   Final diagnoses:  Other pneumothorax  Traumatic pneumothorax, initial encounter  Bilateral pneumothoraces  Other specified hypotension  Closed  fracture of multiple ribs of right side, initial encounter    ED Discharge Orders    None       Ina Kick, MD 08/26/18 2152    Charlynne Pander, MD 08/30/18 1706    Charlynne Pander, MD 08/30/18 [phone]

## 2018-08-26 NOTE — ED Notes (Signed)
[name]- Husband- called- [phone], call with any updates please.

## 2018-08-26 NOTE — H&P (Signed)
Activation and Reason: Level 1 - fall through 2nd floor, estimated 14 feet landing on back  Primary Survey:  Airway: Intact, talking Breathing: Labored; decreased breath sounds on right side; moving air Circulation: Palpable pulses in all 4 ext Disability: GCS 15  [name] is an 58 y.o. female.  HPI: 58yoF s/p fall through 2nd floor this afternoon - reports she fell approximately 14 feet. Laundry room on 2nd floor has been worked on by her husband - flooring partially up and area was covered with a board where there was a large hole in floor. She fell through this to her first floor. Possible brief LOC. Complains of pain in her back and posterior chest walls. Denies any pain in her head, neck, abdomen, pelvis, left upper extremity or either lower extremity. Right upper arm discomfort/ache.  FAST performed by ED-P as I was arriving and was negative in the abdomen; could not visualize heart adequately so equivocal in chest. Please see their note for documentation regarding this procedure  We placed bilateral CTs after CXRs showed bilateral ptx - I supervised ER resident placing right 14 Fr and I placed left sided 14 Fr pigtail chest tube.  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  Social History:  has no history on file for tobacco, alcohol, and drug.  Allergies:  Allergies  Allergen Reactions   Betadine Antibiotic-Moisturize [Bacitracin-Polymyxin B] Itching, Swelling and Rash    Medications: I have reviewed the patient's current medications.  Results for orders placed or performed during the hospital encounter of 08/26/18 (from the past 48 hour(s))  Prepare fresh frozen plasma     Status: None (Preliminary result)   Collection Time: 08/26/18  3:02  PM  Result Value Ref Range   Unit Number [id number]    Blood Component Type THW PLS APHR    Unit division B0    Status of Unit ISSUED    Unit tag comment EMERGENCY RELEASE    Transfusion Status      OK TO TRANSFUSE Performed at [facility], [address] [place], [place] [address]    Unit Number [id number]    Blood Component Type THAWED PLASMA    Unit division 00    Status of Unit ISSUED    Unit tag comment EMERGENCY RELEASE    Transfusion Status OK TO TRANSFUSE   Type and screen Ordered by PROVIDER DEFAULT     Status: None (Preliminary result)   Collection Time: 08/26/18  3:02 PM  Result Value Ref Range   ABO/RH(D) O NEG    Antibody Screen NEG    Sample Expiration      08/29/2018 Performed at [facility], [address] [place], [place] [address]    Unit Number [id number]    Blood Component Type RED CELLS,LR    Unit division 00    Status of Unit ISSUED    Unit tag comment EMERGENCY RELEASE    Transfusion Status OK TO TRANSFUSE    Crossmatch Result PENDING    Unit Number [id number]    Blood Component Type RED CELLS,LR    Unit division 00    Status of Unit ISSUED    Unit tag comment EMERGENCY RELEASE    Transfusion Status OK TO TRANSFUSE  Crossmatch Result PENDING   Ethanol     Status: None   Collection Time: 08/26/18  3:10 PM  Result Value Ref Range   Alcohol, Ethyl (B) <10 <10 mg/dL    Comment: (NOTE) Lowest detectable limit for serum alcohol is 10 mg/dL. For medical purposes only. Performed at [facility], [address] [place], [place] [address]   Lactic acid, plasma     Status: Abnormal   Collection Time: 08/26/18  3:10 PM  Result Value Ref Range   Lactic Acid, Venous 2.6 (HH) 0.5 - 1.9 mmol/L    Comment: CRITICAL RESULT CALLED TO, READ BACK BY AND VERIFIED WITH: REABOOUB, A RN @ 1600 ON 08/26/2018 BY EMOCHE, H Performed at [facility], [address] [place], [place] [address]   ABO/Rh     Status: None    Collection Time: 08/26/18  3:10 PM  Result Value Ref Range   ABO/RH(D)      O NEG Performed at [facility], [address] [place], [place] [address]   CDS serology     Status: None   Collection Time: 08/26/18  3:11 PM  Result Value Ref Range   CDS serology specimen      SPECIMEN WILL BE HELD FOR 14 DAYS IF TESTING IS REQUIRED    Comment: Performed at [facility], [address] [place], [place] [address]  Comprehensive metabolic panel     Status: Abnormal   Collection Time: 08/26/18  3:11 PM  Result Value Ref Range   Sodium 140 135 - 145 mmol/L   Potassium 4.2 3.5 - 5.1 mmol/L   Chloride 105 98 - 111 mmol/L   CO2 24 22 - 32 mmol/L   Glucose, Bld 153 (H) 70 - 99 mg/dL   BUN 12 6 - 20 mg/dL   Creatinine, Ser 2.95 (H) 0.44 - 1.00 mg/dL   Calcium 9.6 8.9 - 62.1 mg/dL   Total Protein 7.1 6.5 - 8.1 g/dL   Albumin 4.0 3.5 - 5.0 g/dL   AST 23 15 - 41 U/L   ALT 16 0 - 44 U/L   Alkaline Phosphatase 86 38 - 126 U/L   Total Bilirubin 0.4 0.3 - 1.2 mg/dL   GFR calc non Af Amer 46 (L) >60 mL/min   GFR calc Af Amer 53 (L) >60 mL/min   Anion gap 11 5 - 15    Comment: Performed at [facility], [address] [place], [place] [address]  CBC     Status: Abnormal   Collection Time: 08/26/18  3:11 PM  Result Value Ref Range   WBC 7.0 4.0 - 10.5 K/uL   RBC 4.88 3.87 - 5.11 MIL/uL   Hemoglobin 14.6 12.0 - 15.0 g/dL   HCT 78.4 (H) 69.6 - 29.5 %   MCV 95.5 80.0 - 100.0 fL   MCH 29.9 26.0 - 34.0 pg   MCHC 31.3 30.0 - 36.0 g/dL   RDW 28.4 13.2 - 44.0 %   Platelets 242 150 - 400 K/uL   nRBC 0.0 0.0 - 0.2 %    Comment: Performed at [facility], [address] [place], [place] [address]  Protime-INR     Status: None   Collection Time: 08/26/18  3:11 PM  Result Value Ref Range   Prothrombin Time 12.7 11.4 - 15.2 seconds   INR 1.0 0.8 - 1.2    Comment: (NOTE) INR goal varies based on device and disease  states. Performed at [facility], [address] [place], [place] [address]     Ct Head Wo Contrast  Result Date: 08/26/2018 CLINICAL DATA:  Felt to the ceiling.  Head trauma. EXAM: CT HEAD WITHOUT CONTRAST CT CERVICAL SPINE WITHOUT CONTRAST TECHNIQUE: Multidetector CT imaging of the head and cervical spine was performed following the standard protocol without intravenous contrast. Multiplanar CT image reconstructions of the cervical spine were also generated. COMPARISON:  None. FINDINGS: CT HEAD FINDINGS Brain: No evidence of acute infarction, hemorrhage, hydrocephalus, extra-axial collection or mass lesion/mass effect. Vascular: No hyperdense vessel or unexpected calcification. Skull: No osseous abnormality. Sinuses/Orbits: Visualized paranasal sinuses are clear. Visualized mastoid sinuses are clear. Visualized orbits demonstrate no focal abnormality. Other: None CT CERVICAL SPINE FINDINGS Alignment: 2 mm anterolisthesis of C7 on T1. Alignment is otherwise anatomic. Skull base and vertebrae: No acute fracture. No primary bone lesion or focal pathologic process. Soft tissues and spinal canal: No prevertebral fluid or swelling. No visible canal hematoma. Disc levels: Anterior cervical fusion from C3 through C6. No hardware failure or complication. Degenerative disease with disc height loss at C2-3 and C6-7. Bilateral moderate facet arthropathy at C7-T1. Mild right facet arthropathy at C2-3, C3-4. Bilateral facet arthropathy at C4-5. No significant foraminal stenosis. Upper chest: Small right apical pneumothorax. Trace left apical pneumothorax. Other: No fluid collection or hematoma. IMPRESSION: 1. No acute intracranial pathology. 2.  No acute osseous injury of the cervical spine. 3. Small right apical pneumothorax.  Trace left apical pneumothorax. Electronically Signed   By: Elige Ko   On: 08/26/2018 16:32   Ct Chest W Contrast  Result Date: 08/26/2018 CLINICAL DATA:  Larey Seat 14 feet onto the back. EXAM: CT CHEST, ABDOMEN, AND PELVIS WITH CONTRAST  TECHNIQUE: Multidetector CT imaging of the chest, abdomen and pelvis was performed following the standard protocol during bolus administration of intravenous contrast. CONTRAST:  OMNIPAQUE IOHEXOL 300 MG/ML  SOLN COMPARISON:  None. FINDINGS: CT CHEST FINDINGS Cardiovascular: No significant vascular findings. Normal heart size. No pericardial effusion. No thoracic aortic aneurysm or dissection. Mediastinum/Nodes: No enlarged mediastinal, hilar, or axillary lymph nodes. Thyroid gland, trachea, and esophagus demonstrate no significant findings. Lungs/Pleura: Bibasilar atelectasis. Small bilateral hemothorax. Small right pneumothorax with a right-sided chest tube directed towards the anterior mid chest. Right-sided chest tube tracks along the minor fissure. Trace left apical pneumothorax with a left-sided chest tube present directed towards the apex. Left chest wall emphysema at the insertion site of the left-sided chest tube likely iatrogenic. Right posterolateral soft tissue emphysema in the chest wall likely related to the pneumothorax and adjacent rib fractures. Musculoskeletal: Thoracic vertebral body heights are maintained and are in anatomic alignment. Nondisplaced right posterior twelfth rib fracture. Comminuted in mildly displaced right posterior ninth, tenth and eleventh rib fractures. No aggressive osseous lesion. CT ABDOMEN PELVIS FINDINGS Hepatobiliary: No hepatic injury or perihepatic hematoma. Gallbladder is unremarkable 3.6 cm hypodense, fluid attenuating right hepatic mass most consistent with a cyst. Similar hypodense, fluid attenuating left hepatic mass measuring 2.3 cm consistent with a cyst. Pancreas: Unremarkable. No pancreatic ductal dilatation or surrounding inflammatory changes. Spleen: Normal in size without focal abnormality. Adrenals/Urinary Tract: Adrenal glands are unremarkable. Kidneys are normal, without renal calculi, focal lesion, or hydronephrosis. Bladder is unremarkable.  Stomach/Bowel: Stomach is within normal limits. Appendix appears normal. No evidence of bowel wall thickening, distention, or inflammatory changes. Vascular/Lymphatic: No significant vascular findings are present. No enlarged abdominal or pelvic lymph nodes. Reproductive: Status post hysterectomy. No  adnexal masses. Other: No abdominal wall hernia or abnormality. No abdominopelvic ascites. Musculoskeletal: Lumbar vertebral body heights are maintained and are in normal anatomic alignment. No acute osseous abnormality. No aggressive osseous lesion. IMPRESSION: 1. Bilateral small pneumothoraces measuring less than 10%. Bilateral chest tubes are present in satisfactory position. Small bilateral hemothorax with bibasilar atelectasis. 2. Nondisplaced right posterior twelfth rib fracture. Comminuted in mildly displaced right posterior ninth, tenth and eleventh rib fractures. Right posterolateral chest wall emphysema adjacent to the lower rib fractures. 3. No acute abdominal or pelvic injury. Electronically Signed   By: Elige Ko   On: 08/26/2018 16:43   Ct Cervical Spine Wo Contrast  Result Date: 08/26/2018 CLINICAL DATA:  Felt to the ceiling.  Head trauma. EXAM: CT HEAD WITHOUT CONTRAST CT CERVICAL SPINE WITHOUT CONTRAST TECHNIQUE: Multidetector CT imaging of the head and cervical spine was performed following the standard protocol without intravenous contrast. Multiplanar CT image reconstructions of the cervical spine were also generated. COMPARISON:  None. FINDINGS: CT HEAD FINDINGS Brain: No evidence of acute infarction, hemorrhage, hydrocephalus, extra-axial collection or mass lesion/mass effect. Vascular: No hyperdense vessel or unexpected calcification. Skull: No osseous abnormality. Sinuses/Orbits: Visualized paranasal sinuses are clear. Visualized mastoid sinuses are clear. Visualized orbits demonstrate no focal abnormality. Other: None CT CERVICAL SPINE FINDINGS Alignment: 2 mm anterolisthesis of C7 on T1.  Alignment is otherwise anatomic. Skull base and vertebrae: No acute fracture. No primary bone lesion or focal pathologic process. Soft tissues and spinal canal: No prevertebral fluid or swelling. No visible canal hematoma. Disc levels: Anterior cervical fusion from C3 through C6. No hardware failure or complication. Degenerative disease with disc height loss at C2-3 and C6-7. Bilateral moderate facet arthropathy at C7-T1. Mild right facet arthropathy at C2-3, C3-4. Bilateral facet arthropathy at C4-5. No significant foraminal stenosis. Upper chest: Small right apical pneumothorax. Trace left apical pneumothorax. Other: No fluid collection or hematoma. IMPRESSION: 1. No acute intracranial pathology. 2.  No acute osseous injury of the cervical spine. 3. Small right apical pneumothorax.  Trace left apical pneumothorax. Electronically Signed   By: Elige Ko   On: 08/26/2018 16:32   Ct Abdomen Pelvis W Contrast  Result Date: 08/26/2018 CLINICAL DATA:  Fell 14 feet onto the back. EXAM: CT CHEST, ABDOMEN, AND PELVIS WITH CONTRAST TECHNIQUE: Multidetector CT imaging of the chest, abdomen and pelvis was performed following the standard protocol during bolus administration of intravenous contrast. CONTRAST:  OMNIPAQUE IOHEXOL 300 MG/ML  SOLN COMPARISON:  None. FINDINGS: CT CHEST FINDINGS Cardiovascular: No significant vascular findings. Normal heart size. No pericardial effusion. No thoracic aortic aneurysm or dissection. Mediastinum/Nodes: No enlarged mediastinal, hilar, or axillary lymph nodes. Thyroid gland, trachea, and esophagus demonstrate no significant findings. Lungs/Pleura: Bibasilar atelectasis. Small bilateral hemothorax. Small right pneumothorax with a right-sided chest tube directed towards the anterior mid chest. Right-sided chest tube tracks along the minor fissure. Trace left apical pneumothorax with a left-sided chest tube present directed towards the apex. Left chest wall emphysema at the  insertion site of the left-sided chest tube likely iatrogenic. Right posterolateral soft tissue emphysema in the chest wall likely related to the pneumothorax and adjacent rib fractures. Musculoskeletal: Thoracic vertebral body heights are maintained and are in anatomic alignment. Nondisplaced right posterior twelfth rib fracture. Comminuted in mildly displaced right posterior ninth, tenth and eleventh rib fractures. No aggressive osseous lesion. CT ABDOMEN PELVIS FINDINGS Hepatobiliary: No hepatic injury or perihepatic hematoma. Gallbladder is unremarkable 3.6 cm hypodense, fluid attenuating right hepatic mass most consistent  with a cyst. Similar hypodense, fluid attenuating left hepatic mass measuring 2.3 cm consistent with a cyst. Pancreas: Unremarkable. No pancreatic ductal dilatation or surrounding inflammatory changes. Spleen: Normal in size without focal abnormality. Adrenals/Urinary Tract: Adrenal glands are unremarkable. Kidneys are normal, without renal calculi, focal lesion, or hydronephrosis. Bladder is unremarkable. Stomach/Bowel: Stomach is within normal limits. Appendix appears normal. No evidence of bowel wall thickening, distention, or inflammatory changes. Vascular/Lymphatic: No significant vascular findings are present. No enlarged abdominal or pelvic lymph nodes. Reproductive: Status post hysterectomy. No adnexal masses. Other: No abdominal wall hernia or abnormality. No abdominopelvic ascites. Musculoskeletal: Lumbar vertebral body heights are maintained and are in normal anatomic alignment. No acute osseous abnormality. No aggressive osseous lesion. IMPRESSION: 1. Bilateral small pneumothoraces measuring less than 10%. Bilateral chest tubes are present in satisfactory position. Small bilateral hemothorax with bibasilar atelectasis. 2. Nondisplaced right posterior twelfth rib fracture. Comminuted in mildly displaced right posterior ninth, tenth and eleventh rib fractures. Right posterolateral  chest wall emphysema adjacent to the lower rib fractures. 3. No acute abdominal or pelvic injury. Electronically Signed   By: Elige KoHetal  Patel   On: 08/26/2018 16:43   Dg Chest Port 1 View  Result Date: 08/26/2018 CLINICAL DATA:  Bilateral chest tube placement EXAM: PORTABLE CHEST 1 VIEW COMPARISON:  Aug 26, 2018 FINDINGS: Bilateral chest tubes have been placed in the interval. The right-sided chest tube terminates over the right lower lobe. The right-sided pneumothorax is smaller now measuring 10 mm at the apex versus 4.9 cm previously. The left-sided chest tube terminates along the lateral aspect of the apex. No definitive left-sided pneumothorax remains. The cardiomediastinal silhouette is stable. Small bilateral pleural effusions with underlying atelectasis or more prominent in the short interval. No other acute abnormalities. IMPRESSION: 1. Bilateral chest tubes have been placed. A small right apical pneumothorax remains, smaller in the interval. No identified left pneumothorax is identified on today's study. 2. Small bilateral pleural effusions and bibasilar atelectasis are more prominent in the interval. 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 BILATERAL moderate-sized pneumothoraces without mediastinal shift. Bibasilar atelectasis. No gross pleural effusion. Minimal LEFT chest wall emphysema. Displaced fracture of the posterolateral LEFT fifth rib. Nondisplaced fracture of the lateral RIGHT eighth rib. IMPRESSION: BILATERAL moderate pneumothoraces and basilar atelectasis without mediastinal shift. BILATERAL rib fractures. Critical Value/emergent results were  called by telephone at the time of interpretation on 08/26/2018 at 3:32 pm to Dr. Juleen ChinaKohut, who verbally acknowledged these results. Electronically Signed   By: Ulyses SouthwardMark  Boles M.D.   On: 08/26/2018 15:33   Dg Humerus Right  Result Date: 08/26/2018 CLINICAL DATA:  RIGHT humeral pain post fall EXAM: RIGHT HUMERUS - 2+ VIEW COMPARISON:  None FINDINGS: Osseous mineralization normal. Joint spaces preserved. No fracture, dislocation, or bone destruction. IMPRESSION: Normal exam. Electronically Signed   By: Ulyses SouthwardMark  Boles M.D.   On: 08/26/2018 16:41    Review of Systems  Constitutional: Negative for chills and fever.  HENT: Negative for ear pain, hearing loss and nosebleeds.   Eyes: Negative for blurred vision and double vision.  Respiratory: Positive for shortness of breath. Negative for cough.   Cardiovascular: Positive for  chest pain. Negative for palpitations.  Gastrointestinal: Negative for abdominal pain, nausea and vomiting.  Genitourinary: Positive for flank pain. Negative for dysuria.  Musculoskeletal: Positive for back pain. Negative for joint pain and neck pain.  Neurological: Negative for sensory change, weakness and headaches.  Psychiatric/Behavioral: Negative for depression and suicidal ideas.   Blood pressure (!) 143/75, pulse 78, temperature 97.8 F (36.6 C), temperature source Tympanic, resp. rate 13, height 5\' 7"  (1.702 m), weight 108.9 kg, SpO2 100 %. Physical Exam  Constitutional: She is oriented to person, place, and time. She appears well-developed and well-nourished.  HENT:  Head: Normocephalic and atraumatic.  Right Ear: External ear normal.  Left Ear: External ear normal.  Nose: Nose normal.  Mouth/Throat: Oropharynx is clear and moist.  Eyes: Pupils are equal, round, and reactive to light. Conjunctivae and EOM are normal.  Neck: Neck supple. No tracheal deviation present.  Cardiovascular: Normal rate and regular rhythm.  Respiratory: Effort normal.  On arrival, decreased BS  right side - present but diminished. +Crepitus right mid/upper; abrasions on upper back as well  GI: Soft. She exhibits no distension. There is no abdominal tenderness. There is no rebound and no guarding.  Musculoskeletal: Normal range of motion.  Neurological: She is alert and oriented to person, place, and time.  Skin: Skin is warm and dry.  Psychiatric: She has a normal mood and affect. Her behavior is normal. Judgment and thought content normal.     INJURY SUMMARY: -Right posterior rib fractures, some comminuted - 9, 10, 11, 12 -Bilateral hemo/pneumothoraces   PLAN -Admit to 4NP for monitoring -Chest tubes to remain to suction tonight, 20 cm H2O -AM CXR ordered -Multimodal pain control - tylenol, ibuprofen, gabapentin, oxycodone, dilaudid all ordered for increasing pain -IS 10x/hr while awake -PT/OT for anticipated assistance with ADLs/ambulation given significant pain she is experiencing at present -I discussed all the above with her as well as her husband Loraine Leriche (via phone). All of their questions were answered. We discussed plan of care. They voiced agreement with plan  Stephanie Coup. Cliffton Asters, M.D. [facility] 08/26/2018, 5:23 PM

## 2018-08-26 NOTE — ED Notes (Signed)
ED TO INPATIENT HANDOFF REPORT  ED Nurse Name and Phone #: maggie  S Name/Age/Gender [name] 58 y.o. female Room/Bed: TRABC/TRABC  Code Status   Code Status: Not on file  Home/SNF/Other Home Patient oriented to: self, place, time and situation Is this baseline? Yes   Triage Complete: Triage complete  Chief Complaint Level 1; Fall  Triage Note No notes on file   Allergies Allergies  Allergen Reactions  . Betadine Antibiotic-Moisturize [Bacitracin-Polymyxin B] Itching, Swelling and Rash    Level of Care/Admitting Diagnosis ED Disposition    ED Disposition Condition Comment   Admit  Hospital Area: [facility] [[id number]]  Level of Care: Progressive [102]  Covid Evaluation: N/A  Diagnosis: Traumatic pneumothorax, initial encounter [130865]  Admitting Physician: TRAUMA MD [[id number]]  Attending Physician: TRAUMA MD [[id number]]  Estimated length of stay: 3 - 4 days  Certification:: I certify this patient will need inpatient services for at least 2 midnights  PT Class (Do Not Modify): Inpatient [101]  PT Acc Code (Do Not Modify): Private [1]       B Medical/Surgery History Past Medical History:  Diagnosis Date  . H/O neck surgery   . Interstitial cystitis       A IV Location/Drains/Wounds Patient Lines/Drains/Airways Status   Active Line/Drains/Airways    Name:   Placement date:   Placement time:   Site:   Days:   Peripheral IV 08/26/18 Left Antecubital   08/26/18    -    Antecubital   less than 1   Peripheral IV 08/26/18 Right Forearm   08/26/18    -    Forearm   less than 1   Chest Tube 1 Right;Anterior Pleural 14 Fr.   08/26/18    1531    Pleural   less than 1   Chest Tube 2 Left;Lateral Pleural 14 Fr.   08/26/18    -    Pleural   less than 1          Intake/Output Last 24 hours  Intake/Output Summary (Last 24 hours) at 08/26/2018 1749 Last data filed at 08/26/2018 1528 Gross per 24 hour  Intake 1000 ml  Output -  Net 1000 ml     Labs/Imaging Results for orders placed or performed during the hospital encounter of 08/26/18 (from the past 48 hour(s))  Prepare fresh frozen plasma     Status: None   Collection Time: 08/26/18  3:02 PM  Result Value Ref Range   Unit Number [id number]    Blood Component Type THW PLS APHR    Unit division B0    Status of Unit REL FROM [facility]    Unit tag comment EMERGENCY RELEASE    Transfusion Status      OK TO TRANSFUSE Performed at [facility], [address], [place], [place] [address]    Unit Number [id number]    Blood Component Type THAWED PLASMA    Unit division 00    Status of Unit REL FROM [facility]    Unit tag comment EMERGENCY RELEASE    Transfusion Status OK TO TRANSFUSE   Type and screen Ordered by PROVIDER DEFAULT     Status: None (Preliminary result)   Collection Time: 08/26/18  3:02 PM  Result Value Ref Range   ABO/RH(D) O NEG    Antibody Screen NEG    Sample Expiration 08/29/2018    Unit Number [id number]    Blood Component Type RED CELLS,LR    Unit division 00  Status of Unit REL FROM [facility]    Unit tag comment EMERGENCY RELEASE    Transfusion Status OK TO TRANSFUSE    Crossmatch Result      NOT NEEDED Performed at [facility], [address] [place], [place] [address]    Unit Number [id number]    Blood Component Type RED CELLS,LR    Unit division 00    Status of Unit ISSUED    Unit tag comment EMERGENCY RELEASE    Transfusion Status OK TO TRANSFUSE    Crossmatch Result PENDING   Ethanol     Status: None   Collection Time: 08/26/18  3:10 PM  Result Value Ref Range   Alcohol, Ethyl (B) <10 <10 mg/dL    Comment: (NOTE) Lowest detectable limit for serum alcohol is 10 mg/dL. For medical purposes only. Performed at [facility], [address] [place], [place] [address]   Lactic acid, plasma     Status: Abnormal   Collection Time: 08/26/18  3:10 PM  Result Value Ref Range   Lactic Acid, Venous 2.6 (HH) 0.5 - 1.9  mmol/L    Comment: CRITICAL RESULT CALLED TO, READ BACK BY AND VERIFIED WITH: REABOOUB, A RN @ 1600 ON 08/26/2018 BY EMOCHE, H Performed at [facility], [address] [place], [place] [address]   ABO/Rh     Status: None   Collection Time: 08/26/18  3:10 PM  Result Value Ref Range   ABO/RH(D)      O NEG Performed at [facility], [address] [place], [place] [address]   CDS serology     Status: None   Collection Time: 08/26/18  3:11 PM  Result Value Ref Range   CDS serology specimen      SPECIMEN WILL BE HELD FOR 14 DAYS IF TESTING IS REQUIRED    Comment: Performed at [facility], [address], [place], [place] [address]  Comprehensive metabolic panel     Status: Abnormal   Collection Time: 08/26/18  3:11 PM  Result Value Ref Range   Sodium 140 135 - 145 mmol/L   Potassium 4.2 3.5 - 5.1 mmol/L   Chloride 105 98 - 111 mmol/L   CO2 24 22 - 32 mmol/L   Glucose, Bld 153 (H) 70 - 99 mg/dL   BUN 12 6 - 20 mg/dL   Creatinine, Ser 9.75 (H) 0.44 - 1.00 mg/dL   Calcium 9.6 8.9 - 88.3 mg/dL   Total Protein 7.1 6.5 - 8.1 g/dL   Albumin 4.0 3.5 - 5.0 g/dL   AST 23 15 - 41 U/L   ALT 16 0 - 44 U/L   Alkaline Phosphatase 86 38 - 126 U/L   Total Bilirubin 0.4 0.3 - 1.2 mg/dL   GFR calc non Af Amer 46 (L) >60 mL/min   GFR calc Af Amer 53 (L) >60 mL/min   Anion gap 11 5 - 15    Comment: Performed at [facility], [address], [place], [place] [address]  CBC     Status: Abnormal   Collection Time: 08/26/18  3:11 PM  Result Value Ref Range   WBC 7.0 4.0 - 10.5 K/uL   RBC 4.88 3.87 - 5.11 MIL/uL   Hemoglobin 14.6 12.0 - 15.0 g/dL   HCT 26.4 (H) 15.8 - 30.9 %   MCV 95.5 80.0 - 100.0 fL   MCH 29.9 26.0 - 34.0 pg   MCHC 31.3 30.0 -  36.0 g/dL   RDW 81.1 91.4 - 78.2 %   Platelets 242 150 - 400 K/uL   nRBC 0.0 0.0 - 0.2 %    Comment: Performed at [facility], [address] [place], [place] [address]  Protime-INR     Status: None   Collection  Time: 08/26/18  3:11 PM  Result Value Ref Range   Prothrombin Time 12.7 11.4 - 15.2 seconds   INR 1.0 0.8 - 1.2    Comment: (NOTE) INR goal varies based on device and disease states. Performed at [facility], [address] [place], [place] [address]    Ct Head Wo Contrast  Result Date: 08/26/2018 CLINICAL DATA:  Felt to the ceiling.  Head trauma. EXAM: CT HEAD WITHOUT CONTRAST CT CERVICAL SPINE WITHOUT CONTRAST TECHNIQUE: Multidetector CT imaging of the head and cervical spine was performed following the standard protocol without intravenous contrast. Multiplanar CT image reconstructions of the cervical spine were also generated. COMPARISON:  None. FINDINGS: CT HEAD FINDINGS Brain: No evidence of acute infarction, hemorrhage, hydrocephalus, extra-axial collection or mass lesion/mass effect. Vascular: No hyperdense vessel or unexpected calcification. Skull: No osseous abnormality. Sinuses/Orbits: Visualized paranasal sinuses are clear. Visualized mastoid sinuses are clear. Visualized orbits demonstrate no focal abnormality. Other: None CT CERVICAL SPINE FINDINGS Alignment: 2 mm anterolisthesis of C7 on T1. Alignment is otherwise anatomic. Skull base and vertebrae: No acute fracture. No primary bone lesion or focal pathologic process. Soft tissues and spinal canal: No prevertebral fluid or swelling. No visible canal hematoma. Disc levels: Anterior cervical fusion from C3 through C6. No hardware failure or complication. Degenerative disease with disc height loss at C2-3 and C6-7. Bilateral moderate facet arthropathy at C7-T1. Mild right facet arthropathy at C2-3, C3-4. Bilateral facet arthropathy at C4-5. No significant foraminal stenosis. Upper chest: Small right apical pneumothorax. Trace left apical pneumothorax. Other: No fluid collection or hematoma. IMPRESSION: 1. No acute intracranial pathology. 2.  No acute osseous injury of the cervical spine. 3. Small right apical pneumothorax.  Trace left  apical pneumothorax. Electronically Signed   By: Elige Ko   On: 08/26/2018 16:32   Ct Chest W Contrast  Result Date: 08/26/2018 CLINICAL DATA:  Larey Seat 14 feet onto the back. EXAM: CT CHEST, ABDOMEN, AND PELVIS WITH CONTRAST TECHNIQUE: Multidetector CT imaging of the chest, abdomen and pelvis was performed following the standard protocol during bolus administration of intravenous contrast. CONTRAST:  OMNIPAQUE IOHEXOL 300 MG/ML  SOLN COMPARISON:  None. FINDINGS: CT CHEST FINDINGS Cardiovascular: No significant vascular findings. Normal heart size. No pericardial effusion. No thoracic aortic aneurysm or dissection. Mediastinum/Nodes: No enlarged mediastinal, hilar, or axillary lymph nodes. Thyroid gland, trachea, and esophagus demonstrate no significant findings. Lungs/Pleura: Bibasilar atelectasis. Small bilateral hemothorax. Small right pneumothorax with a right-sided chest tube directed towards the anterior mid chest. Right-sided chest tube tracks along the minor fissure. Trace left apical pneumothorax with a left-sided chest tube present directed towards the apex. Left chest wall emphysema at the insertion site of the left-sided chest tube likely iatrogenic. Right posterolateral soft tissue emphysema in the chest wall likely related to the pneumothorax and adjacent rib fractures. Musculoskeletal: Thoracic vertebral body heights are maintained and are in anatomic alignment. Nondisplaced right posterior twelfth rib fracture. Comminuted in mildly displaced right posterior ninth, tenth and eleventh rib fractures. No aggressive osseous lesion. CT ABDOMEN PELVIS FINDINGS Hepatobiliary: No hepatic injury or perihepatic hematoma. Gallbladder is unremarkable 3.6 cm hypodense, fluid attenuating right hepatic mass most consistent  with a cyst. Similar hypodense, fluid attenuating left hepatic mass measuring 2.3 cm consistent with a cyst. Pancreas: Unremarkable. No pancreatic ductal dilatation or surrounding  inflammatory changes. Spleen: Normal in size without focal abnormality. Adrenals/Urinary Tract: Adrenal glands are unremarkable. Kidneys are normal, without renal calculi, focal lesion, or hydronephrosis. Bladder is unremarkable. Stomach/Bowel: Stomach is within normal limits. Appendix appears normal. No evidence of bowel wall thickening, distention, or inflammatory changes. Vascular/Lymphatic: No significant vascular findings are present. No enlarged abdominal or pelvic lymph nodes. Reproductive: Status post hysterectomy. No adnexal masses. Other: No abdominal wall hernia or abnormality. No abdominopelvic ascites. Musculoskeletal: Lumbar vertebral body heights are maintained and are in normal anatomic alignment. No acute osseous abnormality. No aggressive osseous lesion. IMPRESSION: 1. Bilateral small pneumothoraces measuring less than 10%. Bilateral chest tubes are present in satisfactory position. Small bilateral hemothorax with bibasilar atelectasis. 2. Nondisplaced right posterior twelfth rib fracture. Comminuted in mildly displaced right posterior ninth, tenth and eleventh rib fractures. Right posterolateral chest wall emphysema adjacent to the lower rib fractures. 3. No acute abdominal or pelvic injury. Electronically Signed   By: Elige KoHetal  Patel   On: 08/26/2018 16:43   Ct Cervical Spine Wo Contrast  Result Date: 08/26/2018 CLINICAL DATA:  Felt to the ceiling.  Head trauma. EXAM: CT HEAD WITHOUT CONTRAST CT CERVICAL SPINE WITHOUT CONTRAST TECHNIQUE: Multidetector CT imaging of the head and cervical spine was performed following the standard protocol without intravenous contrast. Multiplanar CT image reconstructions of the cervical spine were also generated. COMPARISON:  None. FINDINGS: CT HEAD FINDINGS Brain: No evidence of acute infarction, hemorrhage, hydrocephalus, extra-axial collection or mass lesion/mass effect. Vascular: No hyperdense vessel or unexpected calcification. Skull: No osseous abnormality.  Sinuses/Orbits: Visualized paranasal sinuses are clear. Visualized mastoid sinuses are clear. Visualized orbits demonstrate no focal abnormality. Other: None CT CERVICAL SPINE FINDINGS Alignment: 2 mm anterolisthesis of C7 on T1. Alignment is otherwise anatomic. Skull base and vertebrae: No acute fracture. No primary bone lesion or focal pathologic process. Soft tissues and spinal canal: No prevertebral fluid or swelling. No visible canal hematoma. Disc levels: Anterior cervical fusion from C3 through C6. No hardware failure or complication. Degenerative disease with disc height loss at C2-3 and C6-7. Bilateral moderate facet arthropathy at C7-T1. Mild right facet arthropathy at C2-3, C3-4. Bilateral facet arthropathy at C4-5. No significant foraminal stenosis. Upper chest: Small right apical pneumothorax. Trace left apical pneumothorax. Other: No fluid collection or hematoma. IMPRESSION: 1. No acute intracranial pathology. 2.  No acute osseous injury of the cervical spine. 3. Small right apical pneumothorax.  Trace left apical pneumothorax. Electronically Signed   By: Elige KoHetal  Patel   On: 08/26/2018 16:32   Ct Abdomen Pelvis W Contrast  Result Date: 08/26/2018 CLINICAL DATA:  Fell 14 feet onto the back. EXAM: CT CHEST, ABDOMEN, AND PELVIS WITH CONTRAST TECHNIQUE: Multidetector CT imaging of the chest, abdomen and pelvis was performed following the standard protocol during bolus administration of intravenous contrast. CONTRAST:  100mL OMNIPAQUE IOHEXOL 300 MG/ML  SOLN COMPARISON:  None. FINDINGS: CT CHEST FINDINGS Cardiovascular: No significant vascular findings. Normal heart size. No pericardial effusion. No thoracic aortic aneurysm or dissection. Mediastinum/Nodes: No enlarged mediastinal, hilar, or axillary lymph nodes. Thyroid gland, trachea, and esophagus demonstrate no significant findings. Lungs/Pleura: Bibasilar atelectasis. Small bilateral hemothorax. Small right pneumothorax with a right-sided chest tube  directed towards the anterior mid chest. Right-sided chest tube tracks along the minor fissure. Trace left apical pneumothorax with a left-sided chest tube present directed towards the  apex. Left chest wall emphysema at the insertion site of the left-sided chest tube likely iatrogenic. Right posterolateral soft tissue emphysema in the chest wall likely related to the pneumothorax and adjacent rib fractures. Musculoskeletal: Thoracic vertebral body heights are maintained and are in anatomic alignment. Nondisplaced right posterior twelfth rib fracture. Comminuted in mildly displaced right posterior ninth, tenth and eleventh rib fractures. No aggressive osseous lesion. CT ABDOMEN PELVIS FINDINGS Hepatobiliary: No hepatic injury or perihepatic hematoma. Gallbladder is unremarkable 3.6 cm hypodense, fluid attenuating right hepatic mass most consistent with a cyst. Similar hypodense, fluid attenuating left hepatic mass measuring 2.3 cm consistent with a cyst. Pancreas: Unremarkable. No pancreatic ductal dilatation or surrounding inflammatory changes. Spleen: Normal in size without focal abnormality. Adrenals/Urinary Tract: Adrenal glands are unremarkable. Kidneys are normal, without renal calculi, focal lesion, or hydronephrosis. Bladder is unremarkable. Stomach/Bowel: Stomach is within normal limits. Appendix appears normal. No evidence of bowel wall thickening, distention, or inflammatory changes. Vascular/Lymphatic: No significant vascular findings are present. No enlarged abdominal or pelvic lymph nodes. Reproductive: Status post hysterectomy. No adnexal masses. Other: No abdominal wall hernia or abnormality. No abdominopelvic ascites. Musculoskeletal: Lumbar vertebral body heights are maintained and are in normal anatomic alignment. No acute osseous abnormality. No aggressive osseous lesion. IMPRESSION: 1. Bilateral small pneumothoraces measuring less than 10%. Bilateral chest tubes are present in satisfactory  position. Small bilateral hemothorax with bibasilar atelectasis. 2. Nondisplaced right posterior twelfth rib fracture. Comminuted in mildly displaced right posterior ninth, tenth and eleventh rib fractures. Right posterolateral chest wall emphysema adjacent to the lower rib fractures. 3. No acute abdominal or pelvic injury. Electronically Signed   By: Elige Ko   On: 08/26/2018 16:43   Dg Chest Port 1 View  Result Date: 08/26/2018 CLINICAL DATA:  Bilateral chest tube placement EXAM: PORTABLE CHEST 1 VIEW COMPARISON:  Aug 26, 2018 FINDINGS: Bilateral chest tubes have been placed in the interval. The right-sided chest tube terminates over the right lower lobe. The right-sided pneumothorax is smaller now measuring 10 mm at the apex versus 4.9 cm previously. The left-sided chest tube terminates along the lateral aspect of the apex. No definitive left-sided pneumothorax remains. The cardiomediastinal silhouette is stable. Small bilateral pleural effusions with underlying atelectasis or more prominent in the short interval. No other acute abnormalities. IMPRESSION: 1. Bilateral chest tubes have been placed. A small right apical pneumothorax remains, smaller in the interval. No identified left pneumothorax is identified on today's study. 2. Small bilateral pleural effusions and bibasilar atelectasis are more prominent in the interval. 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 BILATERAL moderate-sized pneumothoraces without mediastinal shift. Bibasilar atelectasis. No gross pleural effusion. Minimal LEFT chest wall emphysema. Displaced fracture of the posterolateral  LEFT fifth rib. Nondisplaced fracture of the lateral RIGHT eighth rib. IMPRESSION: BILATERAL moderate pneumothoraces and basilar atelectasis without mediastinal shift. BILATERAL rib fractures. Critical Value/emergent results were called by telephone at the time of interpretation on 08/26/2018 at 3:32 pm to Dr. Juleen China, who verbally acknowledged these results. Electronically Signed   By: Ulyses Southward M.D.   On: 08/26/2018 15:33   Dg Humerus Right  Result Date: 08/26/2018 CLINICAL DATA:  RIGHT humeral pain post fall EXAM: RIGHT HUMERUS - 2+ VIEW COMPARISON:  None FINDINGS: Osseous mineralization normal. Joint spaces preserved. No fracture, dislocation, or bone destruction. IMPRESSION: Normal exam. Electronically Signed   By: Ulyses Southward M.D.   On: 08/26/2018 16:41    Pending Labs Unresulted Labs (From admission, onward)    Start     Ordered   08/26/18 1511  Urinalysis, Routine w reflex microscopic  (Trauma Panel)  ONCE - STAT,   STAT     08/26/18 1511   Signed and Held  HIV antibody (Routine Testing)  Once,   R     Signed and Held   Signed and Held  CBC  Tomorrow morning,   R     Signed and Held          Vitals/Pain Today's Vitals   08/26/18 1700 08/26/18 1715 08/26/18 1730 08/26/18 1745  BP: 140/83 (!) 146/88 (!) 137/94 (!) 141/93  Pulse: 70 71 68 69  Resp: (!) 9 10 (!) 9 11  Temp:      TempSrc:      SpO2: 100% 100% 100% 100%  Weight:      Height:      PainSc:        Isolation Precautions No active isolations  Medications Medications  0.9 %  sodium chloride infusion (1,000 mLs Intravenous New Bag/Given 08/26/18 1508)  iohexol (OMNIPAQUE) 300 MG/ML solution 100 mL (100 mLs Intravenous Contrast Given 08/26/18 1631)  ondansetron (ZOFRAN) injection 4 mg (4 mg Intravenous Given 08/26/18 1558)  fentaNYL (SUBLIMAZE) injection 50 mcg (50 mcg Intravenous Given 08/26/18 1559)  fentaNYL (SUBLIMAZE) injection 50 mcg (50 mcg Intravenous Given 08/26/18 1651)  HYDROmorphone (DILAUDID) injection 1 mg (1  mg Intravenous Given 08/26/18 1748)    Mobility walks High fall risk   Focused Assessments Pulmonary Assessment Handoff:  Lung sounds:         ,    R Recommendations: See Admitting Provider Note  Report given to:   Additional Notes: bilateral chest tubes

## 2018-08-26 NOTE — Progress Notes (Signed)
Orthopedic Tech Progress Note Patient Details:  [name] [birth year] [id number]  Patient ID: [name], female   DOB: 1960-06-06, 58 y.o.   MRN: [id number]   Saul Fordyce 08/26/2018, 4:01 PMLevel one Trauma

## 2018-08-27 ENCOUNTER — Inpatient Hospital Stay (HOSPITAL_COMMUNITY): Payer: BLUE CROSS/BLUE SHIELD

## 2018-08-27 ENCOUNTER — Encounter (HOSPITAL_COMMUNITY): Payer: Self-pay | Admitting: General Practice

## 2018-08-27 LAB — TYPE AND SCREEN
ABO/RH(D): O NEG
Antibody Screen: NEGATIVE
Unit division: 0
Unit division: 0

## 2018-08-27 LAB — BPAM RBC
Blood Product Expiration Date: 202005172359
Blood Product Expiration Date: 202005172359
ISSUE DATE / TIME: 202005021504
ISSUE DATE / TIME: 202005030009
Unit Type and Rh: 5100
Unit Type and Rh: 5100

## 2018-08-27 LAB — CBC
HCT: 40.6 % (ref 36.0–46.0)
Hemoglobin: 12.9 g/dL (ref 12.0–15.0)
MCH: 30.1 pg (ref 26.0–34.0)
MCHC: 31.8 g/dL (ref 30.0–36.0)
MCV: 94.6 fL (ref 80.0–100.0)
Platelets: 168 10*3/uL (ref 150–400)
RBC: 4.29 MIL/uL (ref 3.87–5.11)
RDW: 13.9 % (ref 11.5–15.5)
WBC: 5.4 10*3/uL (ref 4.0–10.5)
nRBC: 0 % (ref 0.0–0.2)

## 2018-08-27 LAB — HIV ANTIBODY (ROUTINE TESTING W REFLEX): HIV Screen 4th Generation wRfx: NONREACTIVE

## 2018-08-27 NOTE — Progress Notes (Signed)
08/26/2018: 2030: received patient from Taffy, RN. Reviewed orders. IVF started. Pain medications given. Purewick in place. Urine sent per order. Chest tubes at set 20cm, draining appropriately. Dressing clean dry and intact to check tube sites. Will continue to monitor patient. Olevia Perches

## 2018-08-27 NOTE — Evaluation (Signed)
Physical Therapy Evaluation Patient Details Name: Kaylee MadeiraRhonda Kent MRN: [id number] DOB: 1961/02/12 Today's Date: 08/27/2018   History of Present Illness  58yoF s/p fall through 2nd floor with resulting traumatic pneumothorax, multiple rib fxs (nondisplaced right posterior twelfth rib fracture and mildly displaced right posterior ninth, tenth and eleventh rib  Clinical Impression  Orders received for PT evaluation. Patient demonstrates deficits in functional mobility as indicated below. Will benefit from continued skilled PT to address deficits and maximize function. Will see as indicated and progress as tolerated.  Increased time and effort throughout session, VSS on room air with activity, bilateral chest tubes temp disconnected from suction ~6 minutes for mobility to bathroom. Saturations stable. Anticipate will progress well.    Follow Up Recommendations No PT follow up;Supervision for mobility/OOB    Equipment Recommendations  (TBD)    Recommendations for Other Services       Precautions / Restrictions Precautions Precautions: Cervical(C collar in place ) Precaution Comments: bilateral chest tubes Required Braces or Orthoses: Cervical Brace Cervical Brace: (awaiting clearance)      Mobility  Bed Mobility Overal bed mobility: Needs Assistance Bed Mobility: Supine to Sit     Supine to sit: Min assist     General bed mobility comments: Min assist to come to EOB, increased time and effort with max multimodal cues for mobility and positoining  Transfers Overall transfer level: Needs assistance Equipment used: 2 person hand held assist Transfers: Sit to/from Stand Sit to Stand: +2 physical assistance;Min assist         General transfer comment: Hands on assist to power up from bed and from toilet with 2 person HHA and encouragement.  Ambulation/Gait Ambulation/Gait assistance: Min assist;+2 physical assistance Gait Distance (Feet): 24 Feet Assistive device: 2 person  hand held assist Gait Pattern/deviations: Step-through pattern;Decreased stride length;Shuffle Gait velocity: decreased Gait velocity interpretation: <1.31 ft/sec, indicative of household ambulator General Gait Details: patient required bilateral HHA and cues for upright posture and safety with mobility.   Stairs            Wheelchair Mobility    Modified Rankin (Stroke Patients Only)       Balance                                             Pertinent Vitals/Pain Pain Assessment: Faces Faces Pain Scale: Hurts even more Pain Location: back, and chest Pain Descriptors / Indicators: Sore;Sharp;Discomfort Pain Intervention(s): Limited activity within patient's tolerance;Monitored during session;Repositioned;Patient requesting pain meds-RN notified    Home Living Family/patient expects to be discharged to:: Private residence Living Arrangements: Spouse/significant other Available Help at Discharge: Family Type of Home: House Home Access: Stairs to enter Entrance Stairs-Rails: Can reach both Entrance Stairs-Number of Steps: 4 Home Layout: Two level        Prior Function Level of Independence: Independent               Hand Dominance   Dominant Hand: Right    Extremity/Trunk Assessment   Upper Extremity Assessment Upper Extremity Assessment: Defer to OT evaluation    Lower Extremity Assessment Lower Extremity Assessment: Generalized weakness       Communication   Communication: No difficulties  Cognition Arousal/Alertness: Awake/alert Behavior During Therapy: WFL for tasks assessed/performed Overall Cognitive Status: Impaired/Different from baseline Area of Impairment: Attention;Following commands;Safety/judgement;Problem solving  Current Attention Level: Selective   Following Commands: Follows multi-step commands with increased time     Problem Solving: Slow processing;Requires verbal cues;Requires  tactile cues        General Comments      Exercises     Assessment/Plan    PT Assessment Patient needs continued PT services  PT Problem List Decreased strength;Decreased activity tolerance;Decreased balance;Decreased mobility;Decreased safety awareness;Cardiopulmonary status limiting activity;Pain       PT Treatment Interventions DME instruction;Gait training;Stair training;Functional mobility training;Therapeutic activities;Therapeutic exercise;Balance training;Neuromuscular re-education;Patient/family education    PT Goals (Current goals can be found in the Care Plan section)  Acute Rehab PT Goals Patient Stated Goal: to go home PT Goal Formulation: With patient Time For Goal Achievement: 09/10/18 Potential to Achieve Goals: Good    Frequency Min 5X/week   Barriers to discharge        Co-evaluation               AM-PAC PT "6 Clicks" Mobility  Outcome Measure Help needed turning from your back to your side while in a flat bed without using bedrails?: A Little Help needed moving from lying on your back to sitting on the side of a flat bed without using bedrails?: A Little Help needed moving to and from a bed to a chair (including a wheelchair)?: A Little Help needed standing up from a chair using your arms (e.g., wheelchair or bedside chair)?: A Little Help needed to walk in hospital room?: A Little Help needed climbing 3-5 steps with a railing? : A Lot 6 Click Score: 17    End of Session Equipment Utilized During Treatment: Gait belt;Cervical collar;Oxygen Activity Tolerance: Patient tolerated treatment well;Patient limited by pain Patient left: in chair;with call bell/phone within reach Nurse Communication: Mobility status PT Visit Diagnosis: Difficulty in walking, not elsewhere classified (R26.2);Pain Pain - Right/Left: (ribs and back)    Time: 0927-0950 PT Time Calculation (min) (ACUTE ONLY): 23 min   Charges:   PT Evaluation $PT Eval Moderate  Complexity: 1 Mod PT Treatments $Therapeutic Activity: 8-22 mins        Charlotte Crumb, PT DPT  Board Certified Neurologic Specialist Acute Rehabilitation Services Pager [phone] Office [phone]   Fabio Asa 08/27/2018, 11:17 AM

## 2018-08-27 NOTE — Progress Notes (Signed)
Patient ID: Kaylee MadeiraRhonda Hemmelgarn, female   DOB: 1961/04/13, 58 y.o.   MRN: [id number] [facility] Progress [facility]:   * No surgery found *  Subjective: Mental status is clear; sitting up in chair.   Objective: Vital signs in last 24 hours: Temp:  [97.6 F (36.4 C)-98.6 F (37 C)] 97.9 F (36.6 C) (05/03 0739) Pulse Rate:  [66-80] 72 (05/03 0739) Resp:  [9-22] 20 (05/03 0739) BP: (103-151)/(65-97) 119/65 (05/03 0739) SpO2:  [96 %-100 %] 99 % (05/03 0739) Weight:  [108.9 kg] 108.9 kg (05/02 1541)  Intake/Output from previous day: 05/02 0701 - 05/03 0700 In: 1000 [I.V.:1000] Out: 780 [Urine:700; Chest Tube:80] Intake/Output this shift: No intake/output data recorded.  Physical Exam: Work of breathing is not labored;  Shallow due to chest tubes;  No air leak noted.    Lab Results:  Results for orders placed or performed during the hospital encounter of 08/26/18 (from the past 48 hour(s))  Prepare fresh frozen plasma     Status: None   Collection Time: 08/26/18  3:02 PM  Result Value Ref Range   Unit Number [id number]    Blood Component Type THW PLS APHR    Unit division B0    [facility]    Unit tag comment EMERGENCY RELEASE    Transfusion Status      OK TO TRANSFUSE Performed at [facility], [address] [place], [place] [address]    Unit Number [id number]    Blood Component Type THAWED PLASMA    Unit division 00    [facility]    Unit tag comment EMERGENCY RELEASE    Transfusion Status OK TO TRANSFUSE   Type and screen Ordered by PROVIDER DEFAULT     Status: None (Preliminary result)   Collection Time: 08/26/18  3:02 PM  Result Value Ref Range   ABO/RH(D) O NEG    Antibody Screen NEG    Sample Expiration      08/29/2018 Performed at [facility], [address] [place], [place] [address]    Unit Number [id number]    Blood Component Type RED CELLS,LR    Unit division 00    [facility]    Unit tag comment EMERGENCY RELEASE    Transfusion Status OK TO TRANSFUSE    Crossmatch Result NOT NEEDED    Unit Number [id number]    Blood Component Type RED CELLS,LR    Unit division 00    Status of Unit ISSUED    Unit tag comment EMERGENCY RELEASE    Transfusion Status OK TO TRANSFUSE    Crossmatch Result COMPATIBLE   Ethanol     Status: None   Collection Time: 08/26/18  3:10 PM  Result Value Ref Range   Alcohol, Ethyl (B) <10 <10 mg/dL    Comment: (NOTE) Lowest detectable limit for serum alcohol is 10 mg/dL. For medical purposes only. Performed at [facility], [address] [place], [place] [address]   Lactic acid, plasma     Status: Abnormal   Collection Time: 08/26/18  3:10 PM  Result Value Ref Range   Lactic Acid, Venous 2.6 (HH) 0.5 - 1.9 mmol/L    Comment: CRITICAL RESULT CALLED TO, READ BACK BY AND VERIFIED WITH: REABOOUB, A RN @ 1600 ON 08/26/2018 BY EMOCHE, H Performed at [facility], [address] [place], [place] [address]   ABO/Rh  Status: None   Collection Time: 08/26/18  3:10 PM  Result Value Ref Range   ABO/RH(D)      O NEG Performed at [facility], [address], [place], [place] [address]   CDS serology     Status: None   Collection Time: 08/26/18  3:11 PM  Result Value Ref Range   CDS serology specimen      SPECIMEN WILL BE HELD FOR 14 DAYS IF TESTING IS REQUIRED    Comment: Performed at [facility], [address], [place], [place] [address]  Comprehensive metabolic panel     Status: Abnormal   Collection Time: 08/26/18  3:11 PM  Result Value Ref Range   Sodium 140 135 - 145 mmol/L   Potassium 4.2 3.5 - 5.1 mmol/L   Chloride 105 98 - 111 mmol/L   CO2 24 22 - 32 mmol/L   Glucose, Bld 153 (H) 70 - 99 mg/dL   BUN 12 6 - 20 mg/dL   Creatinine, Ser 0.98 (H) 0.44 - 1.00 mg/dL   Calcium 9.6 8.9 - 11.9 mg/dL   Total Protein 7.1 6.5 - 8.1 g/dL   Albumin 4.0 3.5 - 5.0 g/dL   AST 23 15 - 41 U/L   ALT 16  0 - 44 U/L   Alkaline Phosphatase 86 38 - 126 U/L   Total Bilirubin 0.4 0.3 - 1.2 mg/dL   GFR calc non Af Amer 46 (L) >60 mL/min   GFR calc Af Amer 53 (L) >60 mL/min   Anion gap 11 5 - 15    Comment: Performed at [facility], [address], [place], [place] [address]  CBC     Status: Abnormal   Collection Time: 08/26/18  3:11 PM  Result Value Ref Range   WBC 7.0 4.0 - 10.5 K/uL   RBC 4.88 3.87 - 5.11 MIL/uL   Hemoglobin 14.6 12.0 - 15.0 g/dL   HCT 95.6 (H) 21.3 - 08.6 %   MCV 95.5 80.0 - 100.0 fL   MCH 29.9 26.0 - 34.0 pg   MCHC 31.3 30.0 - 36.0 g/dL   RDW 57.8 46.9 - 62.9 %   Platelets 242 150 - 400 K/uL   nRBC 0.0 0.0 - 0.2 %    Comment: Performed at [facility], [address] [place], [place] [address]  Protime-INR     Status: None   Collection Time: 08/26/18  3:11 PM  Result Value Ref Range   Prothrombin Time 12.7 11.4 - 15.2 seconds   INR 1.0 0.8 - 1.2    Comment: (NOTE) INR goal varies based on device and disease states. Performed at [facility], [address], [place], [place] [address]   SARS Coronavirus 2 (CEPHEID - Performed in [facility] lab), Hosp Order     Status: None   Collection Time: 08/26/18  5:55 PM  Result Value Ref Range   SARS Coronavirus 2 NEGATIVE NEGATIVE    Comment: (NOTE) If result is NEGATIVE SARS-CoV-2 target nucleic acids are NOT DETECTED. The SARS-CoV-2 RNA is generally detectable in upper and lower  respiratory specimens during the acute phase of infection. The lowest  concentration of SARS-CoV-2 viral copies this assay can detect is 250  copies / mL. A negative result does not preclude SARS-CoV-2 infection  and should not be used as the sole basis for treatment or other  patient management decisions.  A negative result may occur with  improper  specimen collection / handling, submission of specimen other  than nasopharyngeal swab, presence of viral mutation(s) within the  areas targeted by this assay, and  inadequate number of viral copies  (<250 copies / mL). A negative result must be combined with clinical  observations, patient history, and epidemiological information. If result is POSITIVE SARS-CoV-2 target nucleic acids are DETECTED. The SARS-CoV-2 RNA is generally detectable in upper and lower  respiratory specimens dur ing the acute phase of infection.  Positive  results are indicative of active infection with SARS-CoV-2.  Clinical  correlation with patient history and other diagnostic information is  necessary to determine patient infection status.  Positive results do  not rule out bacterial infection or co-infection with other viruses. If result is PRESUMPTIVE POSTIVE SARS-CoV-2 nucleic acids MAY BE PRESENT.   A presumptive positive result was obtained on the submitted specimen  and confirmed on repeat testing.  While 2019 novel coronavirus  (SARS-CoV-2) nucleic acids may be present in the submitted sample  additional confirmatory testing may be necessary for epidemiological  and / or clinical management purposes  to differentiate between  SARS-CoV-2 and other Sarbecovirus currently known to infect humans.  If clinically indicated additional testing with an alternate test  methodology [phone]) is advised. The SARS-CoV-2 RNA is generally  detectable in upper and lower respiratory sp ecimens during the acute  phase of infection. The expected result is Negative. Fact Sheet for Patients:  BoilerBrush.com.cy Fact Sheet for Healthcare Providers: https://pope.com/ This test is not yet approved or cleared by the [place] FDA and has been authorized for detection and/or diagnosis of SARS-CoV-2 by FDA under an Emergency Use Authorization (EUA).  This EUA will remain in effect (meaning this test can be used) for the duration of the COVID-19 declaration under Section 564(b)(1) of the Act, 21 U.S.C. section 360bbb-3(b)(1), unless the  authorization is terminated or revoked sooner. Performed at [facility], [address] [place], [place] [address]   CBC     Status: None   Collection Time: 08/27/18  3:30 AM  Result Value Ref Range   WBC 5.4 4.0 - 10.5 K/uL   RBC 4.29 3.87 - 5.11 MIL/uL   Hemoglobin 12.9 12.0 - 15.0 g/dL   HCT 83.1 51.7 - 61.6 %   MCV 94.6 80.0 - 100.0 fL   MCH 30.1 26.0 - 34.0 pg   MCHC 31.8 30.0 - 36.0 g/dL   RDW 07.3 71.0 - 62.6 %   Platelets 168 150 - 400 K/uL   nRBC 0.0 0.0 - 0.2 %    Comment: Performed at [facility], [address], [place], [place] [address]    Radiology/Results: Ct Head Wo Contrast  Result Date: 08/26/2018 CLINICAL DATA:  Felt to the ceiling.  Head trauma. EXAM: CT HEAD WITHOUT CONTRAST CT CERVICAL SPINE WITHOUT CONTRAST TECHNIQUE: Multidetector CT imaging of the head and cervical spine was performed following the standard protocol without intravenous contrast. Multiplanar CT image reconstructions of the cervical spine were also generated. COMPARISON:  None. FINDINGS: CT HEAD FINDINGS Brain: No evidence of acute infarction, hemorrhage, hydrocephalus, extra-axial collection or mass lesion/mass effect. Vascular: No hyperdense vessel or unexpected calcification. Skull: No osseous abnormality. Sinuses/Orbits: Visualized paranasal sinuses are clear. Visualized mastoid sinuses are clear. Visualized orbits demonstrate no focal abnormality. Other: None CT CERVICAL SPINE FINDINGS Alignment: 2 mm anterolisthesis of C7 on T1. Alignment is otherwise anatomic. Skull base and vertebrae: No acute fracture. No primary bone lesion or focal pathologic process.  Soft tissues and spinal canal: No prevertebral fluid or swelling. No visible canal hematoma. Disc levels: Anterior cervical fusion from C3 through C6. No hardware failure or complication. Degenerative disease with disc height loss at C2-3 and C6-7. Bilateral moderate facet arthropathy at C7-T1. Mild right facet arthropathy at  C2-3, C3-4. Bilateral facet arthropathy at C4-5. No significant foraminal stenosis. Upper chest: Small right apical pneumothorax. Trace left apical pneumothorax. Other: No fluid collection or hematoma. IMPRESSION: 1. No acute intracranial pathology. 2.  No acute osseous injury of the cervical spine. 3. Small right apical pneumothorax.  Trace left apical pneumothorax. Electronically Signed   By: Elige Ko   On: 08/26/2018 16:32   Ct Chest W Contrast  Result Date: 08/26/2018 CLINICAL DATA:  Larey Seat 14 feet onto the back. EXAM: CT CHEST, ABDOMEN, AND PELVIS WITH CONTRAST TECHNIQUE: Multidetector CT imaging of the chest, abdomen and pelvis was performed following the standard protocol during bolus administration of intravenous contrast. CONTRAST:  OMNIPAQUE IOHEXOL 300 MG/ML  SOLN COMPARISON:  None. FINDINGS: CT CHEST FINDINGS Cardiovascular: No significant vascular findings. Normal heart size. No pericardial effusion. No thoracic aortic aneurysm or dissection. Mediastinum/Nodes: No enlarged mediastinal, hilar, or axillary lymph nodes. Thyroid gland, trachea, and esophagus demonstrate no significant findings. Lungs/Pleura: Bibasilar atelectasis. Small bilateral hemothorax. Small right pneumothorax with a right-sided chest tube directed towards the anterior mid chest. Right-sided chest tube tracks along the minor fissure. Trace left apical pneumothorax with a left-sided chest tube present directed towards the apex. Left chest wall emphysema at the insertion site of the left-sided chest tube likely iatrogenic. Right posterolateral soft tissue emphysema in the chest wall likely related to the pneumothorax and adjacent rib fractures. Musculoskeletal: Thoracic vertebral body heights are maintained and are in anatomic alignment. Nondisplaced right posterior twelfth rib fracture. Comminuted in mildly displaced right posterior ninth, tenth and eleventh rib fractures. No aggressive osseous lesion. CT ABDOMEN PELVIS  FINDINGS Hepatobiliary: No hepatic injury or perihepatic hematoma. Gallbladder is unremarkable 3.6 cm hypodense, fluid attenuating right hepatic mass most consistent with a cyst. Similar hypodense, fluid attenuating left hepatic mass measuring 2.3 cm consistent with a cyst. Pancreas: Unremarkable. No pancreatic ductal dilatation or surrounding inflammatory changes. Spleen: Normal in size without focal abnormality. Adrenals/Urinary Tract: Adrenal glands are unremarkable. Kidneys are normal, without renal calculi, focal lesion, or hydronephrosis. Bladder is unremarkable. Stomach/Bowel: Stomach is within normal limits. Appendix appears normal. No evidence of bowel wall thickening, distention, or inflammatory changes. Vascular/Lymphatic: No significant vascular findings are present. No enlarged abdominal or pelvic lymph nodes. Reproductive: Status post hysterectomy. No adnexal masses. Other: No abdominal wall hernia or abnormality. No abdominopelvic ascites. Musculoskeletal: Lumbar vertebral body heights are maintained and are in normal anatomic alignment. No acute osseous abnormality. No aggressive osseous lesion. IMPRESSION: 1. Bilateral small pneumothoraces measuring less than 10%. Bilateral chest tubes are present in satisfactory position. Small bilateral hemothorax with bibasilar atelectasis. 2. Nondisplaced right posterior twelfth rib fracture. Comminuted in mildly displaced right posterior ninth, tenth and eleventh rib fractures. Right posterolateral chest wall emphysema adjacent to the lower rib fractures. 3. No acute abdominal or pelvic injury. Electronically Signed   By: Elige Ko   On: 08/26/2018 16:43   Ct Cervical Spine Wo Contrast  Result Date: 08/26/2018 CLINICAL DATA:  Felt to the ceiling.  Head trauma. EXAM: CT HEAD WITHOUT CONTRAST CT CERVICAL SPINE WITHOUT CONTRAST TECHNIQUE: Multidetector CT imaging of the head and cervical spine was performed following the standard protocol without intravenous  contrast. Multiplanar CT  image reconstructions of the cervical spine were also generated. COMPARISON:  None. FINDINGS: CT HEAD FINDINGS Brain: No evidence of acute infarction, hemorrhage, hydrocephalus, extra-axial collection or mass lesion/mass effect. Vascular: No hyperdense vessel or unexpected calcification. Skull: No osseous abnormality. Sinuses/Orbits: Visualized paranasal sinuses are clear. Visualized mastoid sinuses are clear. Visualized orbits demonstrate no focal abnormality. Other: None CT CERVICAL SPINE FINDINGS Alignment: 2 mm anterolisthesis of C7 on T1. Alignment is otherwise anatomic. Skull base and vertebrae: No acute fracture. No primary bone lesion or focal pathologic process. Soft tissues and spinal canal: No prevertebral fluid or swelling. No visible canal hematoma. Disc levels: Anterior cervical fusion from C3 through C6. No hardware failure or complication. Degenerative disease with disc height loss at C2-3 and C6-7. Bilateral moderate facet arthropathy at C7-T1. Mild right facet arthropathy at C2-3, C3-4. Bilateral facet arthropathy at C4-5. No significant foraminal stenosis. Upper chest: Small right apical pneumothorax. Trace left apical pneumothorax. Other: No fluid collection or hematoma. IMPRESSION: 1. No acute intracranial pathology. 2.  No acute osseous injury of the cervical spine. 3. Small right apical pneumothorax.  Trace left apical pneumothorax. Electronically Signed   By: Elige Ko   On: 08/26/2018 16:32   Ct Abdomen Pelvis W Contrast  Result Date: 08/26/2018 CLINICAL DATA:  Fell 14 feet onto the back. EXAM: CT CHEST, ABDOMEN, AND PELVIS WITH CONTRAST TECHNIQUE: Multidetector CT imaging of the chest, abdomen and pelvis was performed following the standard protocol during bolus administration of intravenous contrast. CONTRAST:  OMNIPAQUE IOHEXOL 300 MG/ML  SOLN COMPARISON:  None. FINDINGS: CT CHEST FINDINGS Cardiovascular: No significant vascular findings. Normal heart  size. No pericardial effusion. No thoracic aortic aneurysm or dissection. Mediastinum/Nodes: No enlarged mediastinal, hilar, or axillary lymph nodes. Thyroid gland, trachea, and esophagus demonstrate no significant findings. Lungs/Pleura: Bibasilar atelectasis. Small bilateral hemothorax. Small right pneumothorax with a right-sided chest tube directed towards the anterior mid chest. Right-sided chest tube tracks along the minor fissure. Trace left apical pneumothorax with a left-sided chest tube present directed towards the apex. Left chest wall emphysema at the insertion site of the left-sided chest tube likely iatrogenic. Right posterolateral soft tissue emphysema in the chest wall likely related to the pneumothorax and adjacent rib fractures. Musculoskeletal: Thoracic vertebral body heights are maintained and are in anatomic alignment. Nondisplaced right posterior twelfth rib fracture. Comminuted in mildly displaced right posterior ninth, tenth and eleventh rib fractures. No aggressive osseous lesion. CT ABDOMEN PELVIS FINDINGS Hepatobiliary: No hepatic injury or perihepatic hematoma. Gallbladder is unremarkable 3.6 cm hypodense, fluid attenuating right hepatic mass most consistent with a cyst. Similar hypodense, fluid attenuating left hepatic mass measuring 2.3 cm consistent with a cyst. Pancreas: Unremarkable. No pancreatic ductal dilatation or surrounding inflammatory changes. Spleen: Normal in size without focal abnormality. Adrenals/Urinary Tract: Adrenal glands are unremarkable. Kidneys are normal, without renal calculi, focal lesion, or hydronephrosis. Bladder is unremarkable. Stomach/Bowel: Stomach is within normal limits. Appendix appears normal. No evidence of bowel wall thickening, distention, or inflammatory changes. Vascular/Lymphatic: No significant vascular findings are present. No enlarged abdominal or pelvic lymph nodes. Reproductive: Status post hysterectomy. No adnexal masses. Other: No  abdominal wall hernia or abnormality. No abdominopelvic ascites. Musculoskeletal: Lumbar vertebral body heights are maintained and are in normal anatomic alignment. No acute osseous abnormality. No aggressive osseous lesion. IMPRESSION: 1. Bilateral small pneumothoraces measuring less than 10%. Bilateral chest tubes are present in satisfactory position. Small bilateral hemothorax with bibasilar atelectasis. 2. Nondisplaced right posterior twelfth rib fracture. Comminuted in mildly displaced  right posterior ninth, tenth and eleventh rib fractures. Right posterolateral chest wall emphysema adjacent to the lower rib fractures. 3. No acute abdominal or pelvic injury. Electronically Signed   By: Elige Ko   On: 08/26/2018 16:43   Dg Chest Port 1 View  Result Date: 08/27/2018 CLINICAL DATA:  Chest tube EXAM: PORTABLE CHEST 1 VIEW COMPARISON:  Yesterday FINDINGS: Bilateral chest tubes in place. There is a small right apical pneumothorax. A left pneumothorax is not visible. Mild linear opacification at the bases. Normal heart size. IMPRESSION: 1. Bilateral chest tube with small right apical pneumothorax. 2. Lower lobe atelectasis. Electronically Signed   By: Marnee Spring M.D.   On: 08/27/2018 07:28   Dg Chest Port 1 View  Result Date: 08/26/2018 CLINICAL DATA:  Bilateral chest tube placement EXAM: PORTABLE CHEST 1 VIEW COMPARISON:  Aug 26, 2018 FINDINGS: Bilateral chest tubes have been placed in the interval. The right-sided chest tube terminates over the right lower lobe. The right-sided pneumothorax is smaller now measuring 10 mm at the apex versus 4.9 cm previously. The left-sided chest tube terminates along the lateral aspect of the apex. No definitive left-sided pneumothorax remains. The cardiomediastinal silhouette is stable. Small bilateral pleural effusions with underlying atelectasis or more prominent in the short interval. No other acute abnormalities. IMPRESSION: 1. Bilateral chest tubes have been  placed. A small right apical pneumothorax remains, smaller in the interval. No identified left pneumothorax is identified on today's study. 2. Small bilateral pleural effusions and bibasilar atelectasis are more prominent in the interval. 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 BILATERAL moderate-sized pneumothoraces without mediastinal shift. Bibasilar atelectasis. No gross pleural effusion. Minimal LEFT chest wall emphysema. Displaced fracture of the posterolateral LEFT fifth rib. Nondisplaced fracture of the lateral RIGHT eighth rib. IMPRESSION: BILATERAL moderate pneumothoraces and basilar atelectasis without mediastinal shift. BILATERAL rib fractures. Critical Value/emergent results were called by telephone at the time of interpretation on 08/26/2018 at 3:32 pm to Dr. Juleen China, who verbally acknowledged these results. Electronically Signed   By: Ulyses Southward M.D.   On: 08/26/2018 15:33   Dg Humerus Right  Result Date: 08/26/2018 CLINICAL DATA:  RIGHT humeral pain post fall EXAM: RIGHT HUMERUS - 2+ VIEW COMPARISON:  None FINDINGS: Osseous mineralization normal. Joint spaces preserved. No fracture, dislocation, or bone destruction. IMPRESSION: Normal exam. Electronically Signed   By: Ulyses Southward M.D.   On: 08/26/2018 16:41    Anti-infectives: Anti-infectives (From admission, onward)   None      Assessment/Plan: Problem List: Patient Active Problem List   Diagnosis Date Noted  . Pneumothorax, traumatic 08/26/2018    CXR shows small apical right pneumo;  She is working with the incentive spirometer.  No changes today.   * No surgery found *     LOS: 1 day   Matt B. Daphine Deutscher, MD, [facility] [phone] beeper [phone]  08/27/2018 10:17 AM

## 2018-08-28 ENCOUNTER — Inpatient Hospital Stay (HOSPITAL_COMMUNITY): Payer: BLUE CROSS/BLUE SHIELD

## 2018-08-28 ENCOUNTER — Other Ambulatory Visit: Payer: Self-pay

## 2018-08-28 LAB — BASIC METABOLIC PANEL
Anion gap: 8 (ref 5–15)
BUN: 9 mg/dL (ref 6–20)
CO2: 26 mmol/L (ref 22–32)
Calcium: 8.6 mg/dL — ABNORMAL LOW (ref 8.9–10.3)
Chloride: 105 mmol/L (ref 98–111)
Creatinine, Ser: 0.81 mg/dL (ref 0.44–1.00)
GFR calc Af Amer: >60 mL/min (ref >60)
GFR calc non Af Amer: >60 mL/min (ref >60)
Glucose, Bld: 91 mg/dL (ref 70–99)
Potassium: 4.4 mmol/L (ref 3.5–5.1)
Sodium: 139 mmol/L (ref 135–145)

## 2018-08-28 LAB — CBC
HCT: 39.3 % (ref 36.0–46.0)
Hemoglobin: 12.7 g/dL (ref 12.0–15.0)
MCH: 30 pg (ref 26.0–34.0)
MCHC: 32.3 g/dL (ref 30.0–36.0)
MCV: 92.7 fL (ref 80.0–100.0)
Platelets: 157 10*3/uL (ref 150–400)
RBC: 4.24 MIL/uL (ref 3.87–5.11)
RDW: 13.2 % (ref 11.5–15.5)
WBC: 5.9 10*3/uL (ref 4.0–10.5)
nRBC: 0 % (ref 0.0–0.2)

## 2018-08-28 MED ORDER — TRAMADOL HCL 50 MG PO TABS
50.0000 mg | ORAL_TABLET | Freq: Four times a day (QID) | ORAL | Status: DC | PRN
  Administered 2018-08-28 – 2018-08-30 (×4): 50 mg via ORAL
  Filled 2018-08-28 (×5): qty 1

## 2018-08-28 MED ORDER — ACETAMINOPHEN 500 MG PO TABS
1000.0000 mg | ORAL_TABLET | Freq: Four times a day (QID) | ORAL | Status: DC
  Administered 2018-08-28 – 2018-08-31 (×11): 1000 mg via ORAL
  Filled 2018-08-28 (×13): qty 2

## 2018-08-28 MED ORDER — METHOCARBAMOL 500 MG PO TABS
500.0000 mg | ORAL_TABLET | Freq: Three times a day (TID) | ORAL | Status: DC
  Administered 2018-08-28 (×3): 500 mg via ORAL
  Filled 2018-08-28 (×4): qty 1

## 2018-08-28 MED ORDER — TRAMADOL HCL 50 MG PO TABS: 50.0000 mg | ORAL_TABLET | Freq: Four times a day (QID) | ORAL | Status: DC | PRN

## 2018-08-28 NOTE — Evaluation (Signed)
Occupational Therapy Evaluation Patient Details Name: [name] MRN: [id number] DOB: 01/12/1961 Today's Date: 08/28/2018    History of Present Illness 58yoF s/p fall through 2nd floor with resulting traumatic pneumothorax, multiple rib fxs (nondisplaced right posterior twelfth rib fracture and mildly displaced right posterior ninth, tenth and eleventh rib   Clinical Impression   This 58 y/o female presents with the above. PTA pt reports independence with ADL and functional mobility. Pt currently limited due to pain and generalized weakness. Pt completing functional mobility using RW with overall minA (+2 safety/lines). She currently requires setup-supervision for seated UB ADL, modA for LB and toileting ADL. Pt pleasant throughout session and with good motivation to work with therapy and return to PLOF. She reports has good family support who are able to assist with ADL/iADL PRN after discharge. Pt will benefit from continued acute OT services to maximize her safety and independence with ADL and mobility prior to return home. Will follow.     Follow Up Recommendations  No OT follow up;Supervision/Assistance - 24 hour(24hr initially)    Equipment Recommendations  3 in 1 bedside commode;Other (comment)(to be further assessed, may not need pending progress)           Precautions / Restrictions Precautions Precautions: Fall Precaution Comments: bilateral chest tubes Restrictions Weight Bearing Restrictions: No      Mobility Bed Mobility Overal bed mobility: Needs Assistance Bed Mobility: Supine to Sit     Supine to sit: Min assist     General bed mobility comments: Min assist to come to EOB, increased time and effort with max multimodal cues for mobility and positoining  Transfers Overall transfer level: Needs assistance Equipment used: 2 person hand held assist Transfers: Sit to/from Stand Sit to Stand: +2 physical assistance;Min assist         General transfer  comment: assist to rise and steady, VCs for safe hand placement    Balance Overall balance assessment: Needs assistance Sitting-balance support: Feet supported Sitting balance-Leahy Scale: Fair     Standing balance support: Bilateral upper extremity supported;During functional activity Standing balance-Leahy Scale: Fair Standing balance comment: able to release UE support for hand hygiene and functional task performance                           ADL either performed or assessed with clinical judgement   ADL Overall ADL's : Needs assistance/impaired Eating/Feeding: Modified independent;Sitting   Grooming: Wash/dry hands;Min guard;Standing Grooming Details (indicate cue type and reason): pt applying hand sanitizer in standing  Upper Body Bathing: Sitting;Min guard   Lower Body Bathing: Moderate assistance;Sit to/from stand   Upper Body Dressing : Set up;Min guard;Sitting   Lower Body Dressing: Moderate assistance;+2 for safety/equipment;Sit to/from stand Lower Body Dressing Details (indicate cue type and reason): minA for standing balance Toilet Transfer: Minimal assistance;+2 for physical assistance;+2 for safety/equipment;Ambulation;Regular Toilet;Grab bars Toilet Transfer Details (indicate cue type and reason): use of two person HHA for mobility into bathroom; trialled use of RW when exiting bathroom and pt performing with minguard-minA (+2 safety/equipement) Toileting- Clothing Manipulation and Hygiene: Minimal assistance;Sit to/from stand Toileting - Clothing Manipulation Details (indicate cue type and reason): assist for gown management; pt able to perform peri-care via lateral leans while seated on toilet, reports increased difficulty/pain with attempts to reach towards buttocks       General ADL Comments: pt mostly limited due to pain, weakness at this time     Vision  Perception     Praxis      Pertinent Vitals/Pain Pain Assessment: Faces Faces  Pain Scale: Hurts even more Pain Location: back Pain Descriptors / Indicators: Sore Pain Intervention(s): Monitored during session;RN gave pain meds during session;Repositioned     Hand Dominance Right   Extremity/Trunk Assessment Upper Extremity Assessment Upper Extremity Assessment: RUE deficits/detail;Generalized weakness RUE Deficits / Details: decreased ROM given current rib fxs RUE: Unable to fully assess due to pain RUE Coordination: decreased gross motor   Lower Extremity Assessment Lower Extremity Assessment: Defer to PT evaluation   Cervical / Trunk Assessment Cervical / Trunk Assessment: Other exceptions Cervical / Trunk Exceptions: pt reports recent cervical sx in Nov 2019   Communication Communication Communication: No difficulties   Cognition Arousal/Alertness: Awake/alert Behavior During Therapy: WFL for tasks assessed/performed Overall Cognitive Status: Within Functional Limits for tasks assessed                                 General Comments: improved processing today   General Comments       Exercises     Shoulder Instructions      Home Living Family/patient expects to be discharged to:: Private residence Living Arrangements: Spouse/significant other Available Help at Discharge: Family Type of Home: House Home Access: Stairs to enter Secretary/administratorntrance Stairs-Number of Steps: 4 Entrance Stairs-Rails: Can reach both Home Layout: Two level     Bathroom Shower/Tub: Producer, television/film/videoWalk-in shower   Bathroom Toilet: Standard     Home Equipment: Shower seat - built in          Prior Functioning/Environment Level of Independence: Independent                 OT Problem List: Decreased strength;Decreased range of motion;Decreased activity tolerance;Impaired balance (sitting and/or standing);Pain;Decreased knowledge of use of DME or AE      OT Treatment/Interventions: Self-care/ADL training;Therapeutic exercise;Neuromuscular education;Energy  conservation;DME and/or AE instruction;Therapeutic activities;Patient/family education;Balance training    OT Goals(Current goals can be found in the care plan section) Acute Rehab OT Goals Patient Stated Goal: to go home OT Goal Formulation: With patient Time For Goal Achievement: 09/11/18 Potential to Achieve Goals: Good ADL Goals Pt Will Perform Grooming: with supervision;standing Pt Will Perform Lower Body Bathing: with supervision;sit to/from stand;with adaptive equipment(AE PRN) Pt Will Perform Lower Body Dressing: with min assist;sit to/from stand;with adaptive equipment;with caregiver independent in assisting(AE vs spouse assist) Pt Will Transfer to Toilet: with supervision;ambulating;regular height toilet Pt Will Perform Toileting - Clothing Manipulation and hygiene: with supervision;sit to/from stand  OT Frequency: Min 2X/week   Barriers to D/C:            Co-evaluation PT/OT/SLP Co-Evaluation/Treatment: Yes Reason for Co-Treatment: Complexity of the patient's impairments (multi-system involvement);For patient/therapist safety;To address functional/ADL transfers PT goals addressed during session: Mobility/safety with mobility OT goals addressed during session: ADL's and self-care      AM-PAC OT "6 Clicks" Daily Activity     Outcome Measure Help from another person eating meals?: None Help from another person taking care of personal grooming?: A Little Help from another person toileting, which includes using toliet, bedpan, or urinal?: A Little Help from another person bathing (including washing, rinsing, drying)?: A Lot Help from another person to put on and taking off regular upper body clothing?: A Little Help from another person to put on and taking off regular lower body clothing?: A Lot 6 Click Score: 17  End of Session Equipment Utilized During Treatment: Engineer, water Communication: Mobility status  Activity Tolerance: Patient tolerated treatment  well Patient left: in chair;with call bell/phone within reach  OT Visit Diagnosis: Other abnormalities of gait and mobility (R26.89);Muscle weakness (generalized) (M62.81);Pain Pain - part of body: (rib cage)                Time: 2500-3704 OT Time Calculation (min): 30 min Charges:  OT General Charges $OT Visit: 1 Visit OT Evaluation $OT Eval Moderate Complexity: 1 Mod  [name], OT [employer] Pager [phone] Office [phone]   [name] 08/28/2018, 10:42 AM

## 2018-08-28 NOTE — Progress Notes (Signed)
Physical Therapy Treatment Patient Details Name: Kaylee MadeiraRhonda Kent MRN: [id number] DOB: 1960-09-25 Today's Date: 08/28/2018    History of Present Illness 58yoF s/p fall through 2nd floor with resulting traumatic pneumothorax, multiple rib fxs (nondisplaced right posterior twelfth rib fracture and mildly displaced right posterior ninth, tenth and eleventh rib    PT Comments    Patient progressing very well with mobility, tolerated 2nd session for prolonged ambulation and did very well. Saturations stable on room air with chest tubes to water seal. Current POC remains appropriate.   Follow Up Recommendations  No PT follow up;Supervision for mobility/OOB     Equipment Recommendations  (TBD)    Recommendations for Other Services       Precautions / Restrictions Precautions Precautions: Fall Precaution Comments: bilateral chest tubes    Mobility  Bed Mobility               General bed mobility comments: received in chair  Transfers Overall transfer level: Needs assistance Equipment used: Rolling walker (2 wheeled) Transfers: Sit to/from Stand Sit to Stand: Min assist         General transfer comment: assist to power up to standing  Ambulation/Gait Ambulation/Gait assistance: Supervision Gait Distance (Feet): 510 Feet Assistive device: Rolling walker (2 wheeled) Gait Pattern/deviations: Step-through pattern;Decreased stride length;Shuffle   Gait velocity interpretation: <1.8 ft/sec, indicate of risk for recurrent falls General Gait Details: improved stability with RW, increased activity tolerance    Stairs             Wheelchair Mobility    Modified Rankin (Stroke Patients Only)       Balance Overall balance assessment: Needs assistance Sitting-balance support: Feet supported Sitting balance-Leahy Scale: Fair     Standing balance support: Bilateral upper extremity supported;During functional activity Standing balance-Leahy Scale: Fair                               Cognition Arousal/Alertness: Awake/alert Behavior During Therapy: WFL for tasks assessed/performed Overall Cognitive Status: Within Functional Limits for tasks assessed                                        Exercises      General Comments        Pertinent Vitals/Pain Faces Pain Scale: Hurts even more Pain Location: back Pain Descriptors / Indicators: Sore Pain Intervention(s): Monitored during session    Home Living                      Prior Function            PT Goals (current goals can now be found in the care plan section) Acute Rehab PT Goals Patient Stated Goal: to go home PT Goal Formulation: With patient Time For Goal Achievement: 09/10/18 Potential to Achieve Goals: Good Progress towards PT goals: Progressing toward goals    Frequency    Min 5X/week      PT Plan Current plan remains appropriate    Co-evaluation              AM-PAC PT "6 Clicks" Mobility   Outcome Measure  Help needed turning from your back to your side while in a flat bed without using bedrails?: A Little Help needed moving from lying on your back to sitting on the side of a flat bed  without using bedrails?: A Little Help needed moving to and from a bed to a chair (including a wheelchair)?: A Little Help needed standing up from a chair using your arms (e.g., wheelchair or bedside chair)?: A Little Help needed to walk in hospital room?: A Little Help needed climbing 3-5 steps with a railing? : A Lot 6 Click Score: 17    End of Session   Activity Tolerance: Patient tolerated treatment well Patient left: in chair;with call bell/phone within reach Nurse Communication: Mobility status PT Visit Diagnosis: Difficulty in walking, not elsewhere classified (R26.2);Pain Pain - Right/Left: (ribs and back)     Time: 1050-1108 PT Time Calculation (min) (ACUTE ONLY): 18 min  Charges:  $Gait Training: 8-22 mins                      Charlotte Crumb, PT DPT  Board Certified Neurologic Specialist Acute Rehabilitation Services Pager [phone] Office [phone]    Fabio Asa 08/28/2018, 2:03 PM

## 2018-08-28 NOTE — TOC Initial Note (Signed)
Transition of Care [facility]) - Initial/Assessment Note    Patient Details  Name: [name] MRN: [id number] Date of Birth: 04-18-1961  Transition of [facility] CM/SW Contact:    Glennon Mac, RN Phone Number: 08/28/2018, 4:35 PM  Clinical Narrative:  58yoF s/p fall through 2nd floor with resulting traumatic pneumothorax, multiple rib fxs.  PTA, pt independent, lives at home with spouse and children.  PT/OT recommending no OP follow up.  SBIRT completed; pt denies ETOH use.     Expected Discharge Plan: Home/Self Care Barriers to Discharge: Continued Medical Work up          Expected Discharge Plan and Services Expected Discharge Plan: Home/Self Care   Discharge Planning Services: CM Consult   Living arrangements for the past 2 months: Single Family Home                                      Prior Living Arrangements/Services Living arrangements for the past 2 months: Single Family Home Lives with:: Spouse Patient language and need for interpreter reviewed:: Yes Do you feel safe going back to the place where you live?: Yes      Need for Family Participation in Patient Care: Yes (Comment) Care giver support system in place?: Yes (comment)   Criminal Activity/Legal Involvement Pertinent to Current Situation/Hospitalization: No - Comment as needed  Activities of Daily Living Home Assistive Devices/Equipment: None ADL Screening (condition at time of admission) Patient's cognitive ability adequate to safely complete daily activities?: Yes Is the patient deaf or have difficulty hearing?: No Does the patient have difficulty seeing, even when wearing glasses/contacts?: No Does the patient have difficulty concentrating, remembering, or making decisions?: No Patient able to express need for assistance with ADLs?: Yes Does the patient have difficulty dressing or bathing?: No Independently performs ADLs?: Yes (appropriate for developmental age) Does the patient have  difficulty walking or climbing stairs?: No Weakness of Legs: None Weakness of Arms/Hands: None  Permission Sought/Granted Permission sought to share information with : Case Manager Permission granted to share information with : Yes, Verbal Permission Granted              Emotional Assessment Appearance:: Appears stated age Attitude/Demeanor/Rapport: Engaged Affect (typically observed): Accepting, Appropriate Orientation: : Oriented to Self, Oriented to Place, Oriented to  Time, Oriented to Situation Alcohol / Substance Use: Not Applicable Psych Involvement: No (comment)  Admission diagnosis:  Other pneumothorax [J93.83] Patient Active Problem List   Diagnosis Date Noted  . Pneumothorax, traumatic 08/26/2018   PCP:  [facility]:   [facility] [address], [place] - [address] [place] [address] Phone: [phone] Fax: [phone]      Readmission Risk Interventions No flowsheet data found.  Quintella Baton, RN, BSN  Trauma/Neuro ICU Case Manager [phone]

## 2018-08-28 NOTE — Progress Notes (Signed)
Physical Therapy Treatment Patient Details Name: Kaylee MadeiraRhonda Kent MRN: [id number] DOB: 03/15/1961 Today's Date: 08/28/2018    History of Present Illness 58yoF s/p fall through 2nd floor with resulting traumatic pneumothorax, multiple rib fxs (nondisplaced right posterior twelfth rib fracture and mildly displaced right posterior ninth, tenth and eleventh rib    PT Comments    Patient progressing towards PT goals, improved functional status and activity tolerance. Will see BID today to progress walking.   Follow Up Recommendations  No PT follow up;Supervision for mobility/OOB     Equipment Recommendations  (TBD)    Recommendations for Other Services       Precautions / Restrictions Precautions Precaution Comments: bilateral chest tubes    Mobility  Bed Mobility Overal bed mobility: Needs Assistance Bed Mobility: Supine to Sit     Supine to sit: Min assist     General bed mobility comments: Min assist to come to EOB, increased time and effort with max multimodal cues for mobility and positoining  Transfers Overall transfer level: Needs assistance Equipment used: 2 person hand held assist Transfers: Sit to/from Stand Sit to Stand: +2 physical assistance;Min assist            Ambulation/Gait Ambulation/Gait assistance: Min guard;Min assist;+2 physical assistance Gait Distance (Feet): 30 Feet Assistive device: 2 person hand held assist;Rolling walker (2 wheeled) Gait Pattern/deviations: Step-through pattern;Decreased stride length;Shuffle Gait velocity: decreased Gait velocity interpretation: <1.31 ft/sec, indicative of household ambulator General Gait Details: Min assist for ambulation with HHA, min guard with use of RW   Stairs             Wheelchair Mobility    Modified Rankin (Stroke Patients Only)       Balance Overall balance assessment: Needs assistance Sitting-balance support: Feet supported Sitting balance-Leahy Scale: Fair     Standing  balance support: Bilateral upper extremity supported;During functional activity Standing balance-Leahy Scale: Fair Standing balance comment: able to release UE support for hand hygiene and functional task performance                            Cognition Arousal/Alertness: Awake/alert Behavior During Therapy: WFL for tasks assessed/performed Overall Cognitive Status: Within Functional Limits for tasks assessed                                 General Comments: improved processing today      Exercises      General Comments        Pertinent Vitals/Pain Pain Assessment: Faces Faces Pain Scale: Hurts even more Pain Location: back Pain Descriptors / Indicators: Sore Pain Intervention(s): Monitored during session    Home Living                      Prior Function            PT Goals (current goals can now be found in the care plan section) Acute Rehab PT Goals Patient Stated Goal: to go home PT Goal Formulation: With patient Time For Goal Achievement: 09/10/18 Potential to Achieve Goals: Good Progress towards PT goals: Progressing toward goals    Frequency    Min 5X/week      PT Plan Current plan remains appropriate    Co-evaluation PT/OT/SLP Co-Evaluation/Treatment: Yes Reason for Co-Treatment: Complexity of the patient's impairments (multi-system involvement);For patient/therapist safety;To address functional/ADL transfers;Necessary to address cognition/behavior during functional activity  PT goals addressed during session: Mobility/safety with mobility OT goals addressed during session: ADL's and self-care      AM-PAC PT "6 Clicks" Mobility   Outcome Measure  Help needed turning from your back to your side while in a flat bed without using bedrails?: A Little Help needed moving from lying on your back to sitting on the side of a flat bed without using bedrails?: A Little Help needed moving to and from a bed to a chair  (including a wheelchair)?: A Little Help needed standing up from a chair using your arms (e.g., wheelchair or bedside chair)?: A Little Help needed to walk in hospital room?: A Little Help needed climbing 3-5 steps with a railing? : A Lot 6 Click Score: 17    End of Session   Activity Tolerance: Patient tolerated treatment well Patient left: in chair;with call bell/phone within reach Nurse Communication: Mobility status PT Visit Diagnosis: Difficulty in walking, not elsewhere classified (R26.2);Pain Pain - Right/Left: (ribs and back)     Time: 4193-7902 PT Time Calculation (min) (ACUTE ONLY): 29 min  Charges:  $Therapeutic Activity: 8-22 mins                     Charlotte Crumb, PT DPT  Board Certified Neurologic Specialist Acute Rehabilitation Services Pager [phone] Office [phone]    Fabio Asa 08/28/2018, 9:24 AM

## 2018-08-28 NOTE — Progress Notes (Addendum)
[facility] Progress Note     Subjective: CC: Pain Patient reports pain and muscle spasm in back. Denies SOB, pulling up to 2000 on IS at times. Tolerating diet.   Objective: Vital signs in last 24 hours: Temp:  [97.5 F (36.4 C)-98.8 F (37.1 C)] 97.5 F (36.4 C) (05/04 0404) Pulse Rate:  [67-80] 73 (05/04 0404) Resp:  [12-20] 12 (05/04 0404) BP: (111-136)/(63-76) 128/76 (05/04 0404) SpO2:  [96 %-100 %] 96 % (05/04 0404)    Intake/Output from previous day: 05/03 0701 - 05/04 0700 In: 2350.6 [P.O.:360; I.V.:1990.6] Out: 894 [Urine:650; Chest Tube:244] Intake/Output this shift: No intake/output data recorded.  PE: Gen:  Alert, NAD, pleasant Card:  Regular rate and rhythm, pedal pulses 2+ BL Pulm:  Normal effort, clear to auscultation bilaterally, bilateral chest tubes with SS drainage Abd: Soft, non-tender, non-distended, +BS Skin: warm and dry, no rashes  Psych: A&Ox3   Lab Results:  Recent Labs    08/26/18 1511 08/27/18 0330  WBC 7.0 5.4  HGB 14.6 12.9  HCT 46.6* 40.6  PLT 242 168   BMET Recent Labs    08/26/18 1511  NA 140  K 4.2  CL 105  CO2 24  GLUCOSE 153*  BUN 12  CREATININE 1.30*  CALCIUM 9.6   PT/INR Recent Labs    08/26/18 1511  LABPROT 12.7  INR 1.0   CMP     Component Value Date/Time   NA 140 08/26/2018 1511   K 4.2 08/26/2018 1511   CL 105 08/26/2018 1511   CO2 24 08/26/2018 1511   GLUCOSE 153 (H) 08/26/2018 1511   BUN 12 08/26/2018 1511   CREATININE 1.30 (H) 08/26/2018 1511   CALCIUM 9.6 08/26/2018 1511   PROT 7.1 08/26/2018 1511   ALBUMIN 4.0 08/26/2018 1511   AST 23 08/26/2018 1511   ALT 16 08/26/2018 1511   ALKPHOS 86 08/26/2018 1511   BILITOT 0.4 08/26/2018 1511   GFRNONAA 46 (L) 08/26/2018 1511   GFRAA 53 (L) 08/26/2018 1511   Lipase  No results found for: LIPASE     Studies/Results: Ct Head Wo Contrast  Result Date: 08/26/2018 CLINICAL DATA:  Felt to the ceiling.  Head trauma. EXAM: CT HEAD  WITHOUT CONTRAST CT CERVICAL SPINE WITHOUT CONTRAST TECHNIQUE: Multidetector CT imaging of the head and cervical spine was performed following the standard protocol without intravenous contrast. Multiplanar CT image reconstructions of the cervical spine were also generated. COMPARISON:  None. FINDINGS: CT HEAD FINDINGS Brain: No evidence of acute infarction, hemorrhage, hydrocephalus, extra-axial collection or mass lesion/mass effect. Vascular: No hyperdense vessel or unexpected calcification. Skull: No osseous abnormality. Sinuses/Orbits: Visualized paranasal sinuses are clear. Visualized mastoid sinuses are clear. Visualized orbits demonstrate no focal abnormality. Other: None CT CERVICAL SPINE FINDINGS Alignment: 2 mm anterolisthesis of C7 on T1. Alignment is otherwise anatomic. Skull base and vertebrae: No acute fracture. No primary bone lesion or focal pathologic process. Soft tissues and spinal canal: No prevertebral fluid or swelling. No visible canal hematoma. Disc levels: Anterior cervical fusion from C3 through C6. No hardware failure or complication. Degenerative disease with disc height loss at C2-3 and C6-7. Bilateral moderate facet arthropathy at C7-T1. Mild right facet arthropathy at C2-3, C3-4. Bilateral facet arthropathy at C4-5. No significant foraminal stenosis. Upper chest: Small right apical pneumothorax. Trace left apical pneumothorax. Other: No fluid collection or hematoma. IMPRESSION: 1. No acute intracranial pathology. 2.  No acute osseous injury of the cervical spine. 3. Small right apical pneumothorax.  Trace  left apical pneumothorax. Electronically Signed   By: Elige Ko   On: 08/26/2018 16:32   Ct Chest W Contrast  Result Date: 08/26/2018 CLINICAL DATA:  Larey Seat 14 feet onto the back. EXAM: CT CHEST, ABDOMEN, AND PELVIS WITH CONTRAST TECHNIQUE: Multidetector CT imaging of the chest, abdomen and pelvis was performed following the standard protocol during bolus administration of  intravenous contrast. CONTRAST:  OMNIPAQUE IOHEXOL 300 MG/ML  SOLN COMPARISON:  None. FINDINGS: CT CHEST FINDINGS Cardiovascular: No significant vascular findings. Normal heart size. No pericardial effusion. No thoracic aortic aneurysm or dissection. Mediastinum/Nodes: No enlarged mediastinal, hilar, or axillary lymph nodes. Thyroid gland, trachea, and esophagus demonstrate no significant findings. Lungs/Pleura: Bibasilar atelectasis. Small bilateral hemothorax. Small right pneumothorax with a right-sided chest tube directed towards the anterior mid chest. Right-sided chest tube tracks along the minor fissure. Trace left apical pneumothorax with a left-sided chest tube present directed towards the apex. Left chest wall emphysema at the insertion site of the left-sided chest tube likely iatrogenic. Right posterolateral soft tissue emphysema in the chest wall likely related to the pneumothorax and adjacent rib fractures. Musculoskeletal: Thoracic vertebral body heights are maintained and are in anatomic alignment. Nondisplaced right posterior twelfth rib fracture. Comminuted in mildly displaced right posterior ninth, tenth and eleventh rib fractures. No aggressive osseous lesion. CT ABDOMEN PELVIS FINDINGS Hepatobiliary: No hepatic injury or perihepatic hematoma. Gallbladder is unremarkable 3.6 cm hypodense, fluid attenuating right hepatic mass most consistent with a cyst. Similar hypodense, fluid attenuating left hepatic mass measuring 2.3 cm consistent with a cyst. Pancreas: Unremarkable. No pancreatic ductal dilatation or surrounding inflammatory changes. Spleen: Normal in size without focal abnormality. Adrenals/Urinary Tract: Adrenal glands are unremarkable. Kidneys are normal, without renal calculi, focal lesion, or hydronephrosis. Bladder is unremarkable. Stomach/Bowel: Stomach is within normal limits. Appendix appears normal. No evidence of bowel wall thickening, distention, or inflammatory changes.  Vascular/Lymphatic: No significant vascular findings are present. No enlarged abdominal or pelvic lymph nodes. Reproductive: Status post hysterectomy. No adnexal masses. Other: No abdominal wall hernia or abnormality. No abdominopelvic ascites. Musculoskeletal: Lumbar vertebral body heights are maintained and are in normal anatomic alignment. No acute osseous abnormality. No aggressive osseous lesion. IMPRESSION: 1. Bilateral small pneumothoraces measuring less than 10%. Bilateral chest tubes are present in satisfactory position. Small bilateral hemothorax with bibasilar atelectasis. 2. Nondisplaced right posterior twelfth rib fracture. Comminuted in mildly displaced right posterior ninth, tenth and eleventh rib fractures. Right posterolateral chest wall emphysema adjacent to the lower rib fractures. 3. No acute abdominal or pelvic injury. Electronically Signed   By: Elige Ko   On: 08/26/2018 16:43   Ct Cervical Spine Wo Contrast  Result Date: 08/26/2018 CLINICAL DATA:  Felt to the ceiling.  Head trauma. EXAM: CT HEAD WITHOUT CONTRAST CT CERVICAL SPINE WITHOUT CONTRAST TECHNIQUE: Multidetector CT imaging of the head and cervical spine was performed following the standard protocol without intravenous contrast. Multiplanar CT image reconstructions of the cervical spine were also generated. COMPARISON:  None. FINDINGS: CT HEAD FINDINGS Brain: No evidence of acute infarction, hemorrhage, hydrocephalus, extra-axial collection or mass lesion/mass effect. Vascular: No hyperdense vessel or unexpected calcification. Skull: No osseous abnormality. Sinuses/Orbits: Visualized paranasal sinuses are clear. Visualized mastoid sinuses are clear. Visualized orbits demonstrate no focal abnormality. Other: None CT CERVICAL SPINE FINDINGS Alignment: 2 mm anterolisthesis of C7 on T1. Alignment is otherwise anatomic. Skull base and vertebrae: No acute fracture. No primary bone lesion or focal pathologic process. Soft tissues and  spinal canal: No prevertebral fluid  or swelling. No visible canal hematoma. Disc levels: Anterior cervical fusion from C3 through C6. No hardware failure or complication. Degenerative disease with disc height loss at C2-3 and C6-7. Bilateral moderate facet arthropathy at C7-T1. Mild right facet arthropathy at C2-3, C3-4. Bilateral facet arthropathy at C4-5. No significant foraminal stenosis. Upper chest: Small right apical pneumothorax. Trace left apical pneumothorax. Other: No fluid collection or hematoma. IMPRESSION: 1. No acute intracranial pathology. 2.  No acute osseous injury of the cervical spine. 3. Small right apical pneumothorax.  Trace left apical pneumothorax. Electronically Signed   By: Elige Ko   On: 08/26/2018 16:32   Ct Abdomen Pelvis W Contrast  Result Date: 08/26/2018 CLINICAL DATA:  Fell 14 feet onto the back. EXAM: CT CHEST, ABDOMEN, AND PELVIS WITH CONTRAST TECHNIQUE: Multidetector CT imaging of the chest, abdomen and pelvis was performed following the standard protocol during bolus administration of intravenous contrast. CONTRAST:  OMNIPAQUE IOHEXOL 300 MG/ML  SOLN COMPARISON:  None. FINDINGS: CT CHEST FINDINGS Cardiovascular: No significant vascular findings. Normal heart size. No pericardial effusion. No thoracic aortic aneurysm or dissection. Mediastinum/Nodes: No enlarged mediastinal, hilar, or axillary lymph nodes. Thyroid gland, trachea, and esophagus demonstrate no significant findings. Lungs/Pleura: Bibasilar atelectasis. Small bilateral hemothorax. Small right pneumothorax with a right-sided chest tube directed towards the anterior mid chest. Right-sided chest tube tracks along the minor fissure. Trace left apical pneumothorax with a left-sided chest tube present directed towards the apex. Left chest wall emphysema at the insertion site of the left-sided chest tube likely iatrogenic. Right posterolateral soft tissue emphysema in the chest wall likely related to the  pneumothorax and adjacent rib fractures. Musculoskeletal: Thoracic vertebral body heights are maintained and are in anatomic alignment. Nondisplaced right posterior twelfth rib fracture. Comminuted in mildly displaced right posterior ninth, tenth and eleventh rib fractures. No aggressive osseous lesion. CT ABDOMEN PELVIS FINDINGS Hepatobiliary: No hepatic injury or perihepatic hematoma. Gallbladder is unremarkable 3.6 cm hypodense, fluid attenuating right hepatic mass most consistent with a cyst. Similar hypodense, fluid attenuating left hepatic mass measuring 2.3 cm consistent with a cyst. Pancreas: Unremarkable. No pancreatic ductal dilatation or surrounding inflammatory changes. Spleen: Normal in size without focal abnormality. Adrenals/Urinary Tract: Adrenal glands are unremarkable. Kidneys are normal, without renal calculi, focal lesion, or hydronephrosis. Bladder is unremarkable. Stomach/Bowel: Stomach is within normal limits. Appendix appears normal. No evidence of bowel wall thickening, distention, or inflammatory changes. Vascular/Lymphatic: No significant vascular findings are present. No enlarged abdominal or pelvic lymph nodes. Reproductive: Status post hysterectomy. No adnexal masses. Other: No abdominal wall hernia or abnormality. No abdominopelvic ascites. Musculoskeletal: Lumbar vertebral body heights are maintained and are in normal anatomic alignment. No acute osseous abnormality. No aggressive osseous lesion. IMPRESSION: 1. Bilateral small pneumothoraces measuring less than 10%. Bilateral chest tubes are present in satisfactory position. Small bilateral hemothorax with bibasilar atelectasis. 2. Nondisplaced right posterior twelfth rib fracture. Comminuted in mildly displaced right posterior ninth, tenth and eleventh rib fractures. Right posterolateral chest wall emphysema adjacent to the lower rib fractures. 3. No acute abdominal or pelvic injury. Electronically Signed   By: Elige Ko   On:  08/26/2018 16:43   Dg Chest Port 1 View  Result Date: 08/27/2018 CLINICAL DATA:  Chest tube EXAM: PORTABLE CHEST 1 VIEW COMPARISON:  Yesterday FINDINGS: Bilateral chest tubes in place. There is a small right apical pneumothorax. A left pneumothorax is not visible. Mild linear opacification at the bases. Normal heart size. IMPRESSION: 1. Bilateral chest tube with small right  apical pneumothorax. 2. Lower lobe atelectasis. Electronically Signed   By: Marnee SpringJonathon  Watts M.D.   On: 08/27/2018 07:28   Dg Chest Port 1 View  Result Date: 08/26/2018 CLINICAL DATA:  Bilateral chest tube placement EXAM: PORTABLE CHEST 1 VIEW COMPARISON:  Aug 26, 2018 FINDINGS: Bilateral chest tubes have been placed in the interval. The right-sided chest tube terminates over the right lower lobe. The right-sided pneumothorax is smaller now measuring 10 mm at the apex versus 4.9 cm previously. The left-sided chest tube terminates along the lateral aspect of the apex. No definitive left-sided pneumothorax remains. The cardiomediastinal silhouette is stable. Small bilateral pleural effusions with underlying atelectasis or more prominent in the short interval. No other acute abnormalities. IMPRESSION: 1. Bilateral chest tubes have been placed. A small right apical pneumothorax remains, smaller in the interval. No identified left pneumothorax is identified on today's study. 2. Small bilateral pleural effusions and bibasilar atelectasis are more prominent in the interval. 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 BILATERAL moderate-sized pneumothoraces without mediastinal shift. Bibasilar  atelectasis. No gross pleural effusion. Minimal LEFT chest wall emphysema. Displaced fracture of the posterolateral LEFT fifth rib. Nondisplaced fracture of the lateral RIGHT eighth rib. IMPRESSION: BILATERAL moderate pneumothoraces and basilar atelectasis without mediastinal shift. BILATERAL rib fractures. Critical Value/emergent results were called by telephone at the time of interpretation on 08/26/2018 at 3:32 pm to Dr. Juleen ChinaKohut, who verbally acknowledged these results. Electronically Signed   By: Ulyses SouthwardMark  Boles M.D.   On: 08/26/2018 15:33   Dg Humerus Right  Result Date: 08/26/2018 CLINICAL DATA:  RIGHT humeral pain post fall EXAM: RIGHT HUMERUS - 2+ VIEW COMPARISON:  None FINDINGS: Osseous mineralization normal. Joint spaces preserved. No fracture, dislocation, or bone destruction. IMPRESSION: Normal exam. Electronically Signed   By: Ulyses SouthwardMark  Boles M.D.   On: 08/26/2018 16:41    Anti-infectives: Anti-infectives (From admission, onward)   None       Assessment/Plan Fall through 2nd floor R posterior ribs 9-12, bilateral HPTX - pain control, IS, pulm toilet - repeat CXR this AM, may be able to put chest tubes to WS  FEN: reg diet VTE: SQ heparin ID: no current abx  Dispo: Continue PT/OT. Pain control. Bilateral CT  Updated patient's husband Loraine LericheMark.   LOS: 2 days    Wells GuilesKelly Rayburn , [facility] 08/28/2018, 8:52 AM Pager: [phone]

## 2018-08-29 ENCOUNTER — Inpatient Hospital Stay (HOSPITAL_COMMUNITY): Payer: BLUE CROSS/BLUE SHIELD

## 2018-08-29 LAB — BLOOD PRODUCT ORDER (VERBAL) VERIFICATION

## 2018-08-29 MED ORDER — IBUPROFEN 200 MG PO TABS
600.0000 mg | ORAL_TABLET | Freq: Four times a day (QID) | ORAL | Status: DC
  Administered 2018-08-29 – 2018-08-31 (×9): 600 mg via ORAL
  Filled 2018-08-29 (×9): qty 3

## 2018-08-29 MED ORDER — METHOCARBAMOL 750 MG PO TABS
750.0000 mg | ORAL_TABLET | Freq: Three times a day (TID) | ORAL | Status: DC
  Administered 2018-08-29 – 2018-08-31 (×7): 750 mg via ORAL
  Filled 2018-08-29 (×7): qty 1

## 2018-08-29 NOTE — Progress Notes (Signed)
Patient does not have any IV's because both became infiltrated. Put heat packs on site to decrease swelling.

## 2018-08-29 NOTE — Progress Notes (Signed)
  Ambulated BID per patient request. Tolerated well.   08/29/18 1600  PT Visit Information  Last PT Received On 08/29/18  Assistance Needed +1  History of Present Illness 58yoF s/p fall through 2nd floor with resulting traumatic pneumothorax, multiple rib fxs (nondisplaced right posterior twelfth rib fracture and mildly displaced right posterior ninth, tenth and eleventh rib  Subjective Data  Subjective eager to walk  Patient Stated Goal to go home  Precautions  Precautions Fall  Precaution Comments bilateral chest tubes  Pain Assessment  Pain Assessment Faces  Faces Pain Scale 4  Pain Location back  Pain Descriptors / Indicators Sore  Pain Intervention(s) Monitored during session  Cognition  Arousal/Alertness Awake/alert  Behavior During Therapy [facility] for tasks assessed/performed  Overall Cognitive Status Within Functional Limits for tasks assessed  Bed Mobility  General bed mobility comments received in chair requesting to walk  Transfers  Overall transfer level Needs assistance  Equipment used Rolling walker (2 wheeled)  Transfers Sit to/from Stand  Sit to Stand Supervision  General transfer comment serupvision for safety, cues for chest tube line awareness  Ambulation/Gait  Ambulation/Gait assistance Supervision  Gait Distance (Feet) 500 Feet  Assistive device Rolling walker (2 wheeled)  Gait Pattern/deviations Step-through pattern;Decreased stride length;Shuffle  General Gait Details improved stability with RW, increased activity tolerance   Gait velocity interpretation 1.31 - 2.62 ft/sec, indicative of limited community ambulator  Balance  Overall balance assessment Needs assistance  Sitting-balance support Feet supported  Sitting balance-Leahy Scale Fair  Standing balance support Bilateral upper extremity supported;During functional activity  Standing balance-Leahy Scale Fair  PT - End of Session  Activity Tolerance Patient tolerated treatment well  Patient left in  chair;with call bell/phone within reach  Nurse Communication Mobility status   PT - Assessment/Plan  PT Plan Current plan remains appropriate  PT Visit Diagnosis Difficulty in walking, not elsewhere classified (R26.2);Pain  Pain - Right/Left  (ribs and back)  PT Frequency (ACUTE ONLY) Min 5X/week  Follow Up Recommendations No PT follow up;Supervision for mobility/OOB  PT equipment  (TBD)  AM-PAC PT "6 Clicks" Mobility Outcome Measure (Version 2)  Help needed turning from your back to your side while in a flat bed without using bedrails? 3  Help needed moving from lying on your back to sitting on the side of a flat bed without using bedrails? 3  Help needed moving to and from a bed to a chair (including a wheelchair)? 3  Help needed standing up from a chair using your arms (e.g., wheelchair or bedside chair)? 3  Help needed to walk in hospital room? 3  Help needed climbing 3-5 steps with a railing?  2  6 Click Score 17  Consider Recommendation of Discharge To: Home with [facility]  PT Goal Progression  Progress towards PT goals Progressing toward goals  Acute Rehab PT Goals  PT Goal Formulation With patient  Time For Goal Achievement 09/10/18  Potential to Achieve Goals Good  PT Time Calculation  PT Start Time (ACUTE ONLY) 1607  PT Stop Time (ACUTE ONLY) 1624  PT Time Calculation (min) (ACUTE ONLY) 17 min  PT General Charges  $$ ACUTE PT VISIT 1 Visit  PT Treatments  $Gait Training 8-22 mins    Charlotte Crumb, PT DPT  Board Certified Neurologic Specialist Acute Rehabilitation Services Pager [phone] Office [phone]

## 2018-08-29 NOTE — Progress Notes (Signed)
[facility] Progress Note     Subjective: CC: Pain Pain in back with laying in bed. Denies SOB and pulling close to 2250 on IS. No pain otherwise. Tolerating diet.   Objective: Vital signs in last 24 hours: Temp:  [97.9 F (36.6 C)-98.8 F (37.1 C)] 98.6 F (37 C) (05/05 0400) Pulse Rate:  [68-71] 71 (05/04 2358) Resp:  [12-20] 12 (05/04 2000) BP: (109-137)/(57-78) 109/57 (05/05 0400) SpO2:  [96 %-100 %] 96 % (05/04 2358) Last BM Date: 08/28/18  Intake/Output from previous day: 05/04 0701 - 05/05 0700 In: 783.7 [I.V.:783.7] Out: 344 [Chest Tube:344] Intake/Output this shift: No intake/output data recorded.  PE: Gen:  Alert, NAD, pleasant Card:  Regular rate and rhythm, pedal pulses 2+ BL Pulm:  Normal effort, clear to auscultation bilaterally, bilateral chest tubes with SS drainage Abd: Soft, non-tender, non-distended, +BS Skin: warm and dry, no rashes  Psych: A&Ox3   Lab Results:  Recent Labs    08/27/18 0330 08/28/18 0929  WBC 5.4 5.9  HGB 12.9 12.7  HCT 40.6 39.3  PLT 168 157   BMET Recent Labs    08/26/18 1511 08/28/18 0929  NA 140 139  K 4.2 4.4  CL 105 105  CO2 24 26  GLUCOSE 153* 91  BUN 12 9  CREATININE 1.30* 0.81  CALCIUM 9.6 8.6*   PT/INR Recent Labs    08/26/18 1511  LABPROT 12.7  INR 1.0   CMP     Component Value Date/Time   NA 139 08/28/2018 0929   K 4.4 08/28/2018 0929   CL 105 08/28/2018 0929   CO2 26 08/28/2018 0929   GLUCOSE 91 08/28/2018 0929   BUN 9 08/28/2018 0929   CREATININE 0.81 08/28/2018 0929   CALCIUM 8.6 (L) 08/28/2018 0929   PROT 7.1 08/26/2018 1511   ALBUMIN 4.0 08/26/2018 1511   AST 23 08/26/2018 1511   ALT 16 08/26/2018 1511   ALKPHOS 86 08/26/2018 1511   BILITOT 0.4 08/26/2018 1511   GFRNONAA >60 08/28/2018 0929   GFRAA >60 08/28/2018 0929   Lipase  No results found for: LIPASE     Studies/Results: Dg Chest Port 1 View  Result Date: 08/29/2018 CLINICAL DATA:  Bilateral  pneumothorax and chest tube EXAM: PORTABLE CHEST 1 VIEW COMPARISON:  Yesterday FINDINGS: Bilateral chest tube in place. Small right apical pneumothorax that is stable to decreased. No visible left-sided pneumothorax. Normal heart size. Mild atelectasis at the bases. No pleural fluid. IMPRESSION: Bilateral chest tube with small right apical pneumothorax that is stable to decreased. Electronically Signed   By: Marnee Spring M.D.   On: 08/29/2018 08:32   Dg Chest Port 1 View  Result Date: 08/28/2018 CLINICAL DATA:  BILATERAL pneumothorax post fall. Continued surveillance. EXAM: PORTABLE CHEST 1 VIEW COMPARISON:  08/27/2018 FINDINGS: Pigtail chest 2 catheters are noted on the RIGHT and LEFT. The LEFT lung is well re-expanded. The RIGHT lung demonstrates a small apically 5% or less pneumothorax. LEFT basilar atelectasis. Small BILATERAL effusions. IMPRESSION: Stable chest.  Small RIGHT apical pneumothorax is unchanged. Electronically Signed   By: Elsie Stain M.D.   On: 08/28/2018 10:03    Anti-infectives: Anti-infectives (From admission, onward)   None       Assessment/Plan Fall through 2nd floor R posterior ribs 9-12, bilateral HPTX - pain control, IS, pulm toilet - repeat CXR this AM with small R apical PTX that is stable to decreased - L CT removed, keep R CT to WS until output <100  cc in 24h  FEN: reg diet VTE: SQ heparin ID: no current abx  Dispo: Continue PT/OT. Pain control. Continue R CT on WS.  Updated patient's husband Loraine LericheMark.   LOS: 3 days    Wells GuilesKelly Rayburn , [facility] 08/29/2018, 8:40 AM Pager: [phone]

## 2018-08-29 NOTE — Progress Notes (Signed)
Physical Therapy Treatment Patient Details Name: [name] MRN: [id number] DOB: January 04, 1961 Today's Date: 08/29/2018    History of Present Illness 58yoF s/p fall through 2nd floor with resulting traumatic pneumothorax, multiple rib fxs (nondisplaced right posterior twelfth rib fracture and mildly displaced right posterior ninth, tenth and eleventh rib    PT Comments    Progressing well with mobility, improved activity tolerance and ambulation. Continues to feel more comfortable with use of RW. Current POC remains appropriate. Encouraged continued mobility with nursing.   Follow Up Recommendations  No PT follow up;Supervision for mobility/OOB     Equipment Recommendations  (TBD)    Recommendations for Other Services       Precautions / Restrictions Precautions Precautions: Fall Precaution Comments: bilateral chest tubes    Mobility  Bed Mobility Overal bed mobility: Needs Assistance Bed Mobility: Supine to Sit     Supine to sit: Min assist     General bed mobility comments: Continues to required min assist to come to EOB, increased time and effort with max multimodal cues for mobility and positoining  Transfers Overall transfer level: Needs assistance Equipment used: Rolling walker (2 wheeled) Transfers: Sit to/from Stand Sit to Stand: Min guard         General transfer comment: Min guard for power up to standing with modified hand placement, increased time to perform  Ambulation/Gait Ambulation/Gait assistance: Supervision Gait Distance (Feet): 600 Feet Assistive device: Rolling walker (2 wheeled) Gait Pattern/deviations: Step-through pattern;Decreased stride length;Shuffle   Gait velocity interpretation: 1.31 - 2.62 ft/sec, indicative of limited community ambulator General Gait Details: improved stability with RW, increased activity tolerance    Stairs             Wheelchair Mobility    Modified Rankin (Stroke Patients Only)        Balance Overall balance assessment: Needs assistance Sitting-balance support: Feet supported Sitting balance-Leahy Scale: Fair     Standing balance support: Bilateral upper extremity supported;During functional activity Standing balance-Leahy Scale: Fair                              Cognition Arousal/Alertness: Awake/alert Behavior During Therapy: WFL for tasks assessed/performed Overall Cognitive Status: Within Functional Limits for tasks assessed                                        Exercises      General Comments        Pertinent Vitals/Pain Pain Assessment: Faces Faces Pain Scale: Hurts little more Pain Location: back Pain Descriptors / Indicators: Sore Pain Intervention(s): Monitored during session    Home Living                      Prior Function            PT Goals (current goals can now be found in the care plan section) Acute Rehab PT Goals Patient Stated Goal: to go home PT Goal Formulation: With patient Time For Goal Achievement: 09/10/18 Potential to Achieve Goals: Good Progress towards PT goals: Progressing toward goals    Frequency    Min 5X/week      PT Plan Current plan remains appropriate    Co-evaluation              AM-PAC PT "6 Clicks" Mobility   Outcome Measure  Help needed turning from your back to your side while in a flat bed without using bedrails?: A Little Help needed moving from lying on your back to sitting on the side of a flat bed without using bedrails?: A Little Help needed moving to and from a bed to a chair (including a wheelchair)?: A Little Help needed standing up from a chair using your arms (e.g., wheelchair or bedside chair)?: A Little Help needed to walk in hospital room?: A Little Help needed climbing 3-5 steps with a railing? : A Lot 6 Click Score: 17    End of Session Equipment Utilized During Treatment: Cervical collar Activity Tolerance: Patient tolerated  treatment well Patient left: in chair;with call bell/phone within reach Nurse Communication: Mobility status PT Visit Diagnosis: Difficulty in walking, not elsewhere classified (R26.2);Pain Pain - Right/Left: (ribs and back)     Time: [id number] PT Time Calculation (min) (ACUTE ONLY): 20 min  Charges:  $Gait Training: 8-22 mins                     [name], PT DPT  Board Certified Neurologic Specialist Acute Rehabilitation Services Pager [phone] Office [phone]    [name] 08/29/2018, 2:47 PM

## 2018-08-29 NOTE — Progress Notes (Signed)
VAST RN consulted to place IV. Upon arrival at bedside pt up in chair and eating breakfast. Spoke with unit RN and determined pt does not need IV access at this time.

## 2018-08-30 ENCOUNTER — Inpatient Hospital Stay (HOSPITAL_COMMUNITY): Payer: BLUE CROSS/BLUE SHIELD

## 2018-08-30 MED ORDER — POLYETHYLENE GLYCOL 3350 17 G PO PACK
17.0000 g | PACK | Freq: Every day | ORAL | Status: DC
  Administered 2018-08-30: 17 g via ORAL
  Filled 2018-08-30 (×2): qty 1

## 2018-08-30 NOTE — Progress Notes (Signed)
Physical Therapy Treatment Patient Details Name: [name] MRN: [id number] DOB: 03-03-1961 Today's Date: 08/30/2018    History of Present Illness 58yoF s/p fall through 2nd floor with resulting traumatic pneumothorax, multiple rib fxs (nondisplaced right posterior twelfth rib fracture and mildly displaced right posterior ninth, tenth and eleventh rib    PT Comments    Progressing well towards PT goals, continues to feel more confident with use of RW. Eager to go home. Current POC remains appropriate.   Follow Up Recommendations  No PT follow up;Supervision for mobility/OOB     Equipment Recommendations  (TBD)    Recommendations for Other Services       Precautions / Restrictions Precautions Precautions: Fall Precaution Comments: unilateral chest tube    Mobility  Bed Mobility               General bed mobility comments: received in chair requesting to walk  Transfers Overall transfer level: Needs assistance Equipment used: Rolling walker (2 wheeled) Transfers: Sit to/from Stand Sit to Stand: Supervision         General transfer comment: serupvision for safety, cues for chest tube line awareness  Ambulation/Gait Ambulation/Gait assistance: Supervision Gait Distance (Feet): 520 Feet Assistive device: Rolling walker (2 wheeled) Gait Pattern/deviations: Step-through pattern;Decreased stride length;Shuffle   Gait velocity interpretation: 1.31 - 2.62 ft/sec, indicative of limited community ambulator General Gait Details: mobilizing well with use of RW   Stairs             Wheelchair Mobility    Modified Rankin (Stroke Patients Only)       Balance Overall balance assessment: Needs assistance Sitting-balance support: Feet supported Sitting balance-Leahy Scale: Fair     Standing balance support: Bilateral upper extremity supported;During functional activity Standing balance-Leahy Scale: Fair                               Cognition Arousal/Alertness: Awake/alert Behavior During Therapy: WFL for tasks assessed/performed Overall Cognitive Status: Within Functional Limits for tasks assessed                                        Exercises      General Comments        Pertinent Vitals/Pain Pain Assessment: Faces Pain Score: 3  Pain Location: back Pain Descriptors / Indicators: Sore Pain Intervention(s): Monitored during session    Home Living                      Prior Function            PT Goals (current goals can now be found in the care plan section) Acute Rehab PT Goals Patient Stated Goal: to go home PT Goal Formulation: With patient Time For Goal Achievement: 09/10/18 Potential to Achieve Goals: Good Progress towards PT goals: Progressing toward goals    Frequency    Min 5X/week      PT Plan Current plan remains appropriate    Co-evaluation              AM-PAC PT "6 Clicks" Mobility   Outcome Measure  Help needed turning from your back to your side while in a flat bed without using bedrails?: A Little Help needed moving from lying on your back to sitting on the side of a flat bed without using bedrails?: A  Little Help needed moving to and from a bed to a chair (including a wheelchair)?: A Little Help needed standing up from a chair using your arms (e.g., wheelchair or bedside chair)?: A Little Help needed to walk in hospital room?: A Little Help needed climbing 3-5 steps with a railing? : A Lot 6 Click Score: 17    End of Session   Activity Tolerance: Patient tolerated treatment well Patient left: in chair;with call bell/phone within reach Nurse Communication: Mobility status PT Visit Diagnosis: Difficulty in walking, not elsewhere classified (R26.2);Pain Pain - Right/Left: (ribs and back)     Time: [id number] PT Time Calculation (min) (ACUTE ONLY): 19 min  Charges:  $Gait Training: 8-22 mins                     Charlotte Crumbevon Haile Toppins,  PT DPT  Board Certified Neurologic Specialist Acute Rehabilitation Services Pager [phone] Office [phone]    Fabio AsaDevon J Kendal Ghazarian 08/30/2018, 1:16 PM

## 2018-08-30 NOTE — Progress Notes (Signed)
Orthopedic Tech Progress Note Patient Details:  [name] February 04, 1961 [id number] Received paged from secretary regarding a soft collar for patient. As I was going into the room the RN came out and she took the collar. Ortho Devices Type of Ortho Device: Soft collar Ortho Device/Splint Interventions: Other (comment)   Post Interventions Patient Tolerated: Other (comment) Instructions Provided: Other (comment)   Donald Pore 08/30/2018, 9:29 AM

## 2018-08-30 NOTE — Progress Notes (Signed)
[facility] Progress Note     Subjective: CC: wants to get home Patient reports pain control much improved with changes we made yesterday. Denies SOB. Pulling 2500 on IS. Tolerating diet and denies nausea, passing flatus, no BM.   Objective: Vital signs in last 24 hours: Temp:  [98.2 F (36.8 C)-99 F (37.2 C)] 98.4 F (36.9 C) (05/06 0426) Pulse Rate:  [70-78] 70 (05/06 0426) Resp:  [20] 20 (05/05 0914) BP: (122-142)/(71-95) 133/74 (05/06 0426) SpO2:  [93 %-96 %] 93 % (05/06 0426) Last BM Date: 08/28/18  Intake/Output from previous day: 05/05 0701 - 05/06 0700 In: 480 [P.O.:480] Out: 190 [Chest Tube:190] Intake/Output this shift: No intake/output data recorded.  PE: Gen: Alert, NAD, pleasant Card: Regular rate and rhythm, pedal pulses 2+ BL Pulm: Normal effort, clear to auscultation bilaterally, bilateral chest tubes with SS drainage Abd: Soft, non-tender, non-distended,+BS Skin: warm and dry, no rashes  Psych: A&Ox3   Lab Results:  Recent Labs    08/28/18 0929  WBC 5.9  HGB 12.7  HCT 39.3  PLT 157   BMET Recent Labs    08/28/18 0929  NA 139  K 4.4  CL 105  CO2 26  GLUCOSE 91  BUN 9  CREATININE 0.81  CALCIUM 8.6*   PT/INR No results for input(s): LABPROT, INR in the last 72 hours. CMP     Component Value Date/Time   NA 139 08/28/2018 0929   K 4.4 08/28/2018 0929   CL 105 08/28/2018 0929   CO2 26 08/28/2018 0929   GLUCOSE 91 08/28/2018 0929   BUN 9 08/28/2018 0929   CREATININE 0.81 08/28/2018 0929   CALCIUM 8.6 (L) 08/28/2018 0929   PROT 7.1 08/26/2018 1511   ALBUMIN 4.0 08/26/2018 1511   AST 23 08/26/2018 1511   ALT 16 08/26/2018 1511   ALKPHOS 86 08/26/2018 1511   BILITOT 0.4 08/26/2018 1511   GFRNONAA >60 08/28/2018 0929   GFRAA >60 08/28/2018 0929   Lipase  No results found for: LIPASE     Studies/Results: Dg Chest Port 1 View  Result Date: 08/29/2018 CLINICAL DATA:  Bilateral pneumothorax and chest tube EXAM:  PORTABLE CHEST 1 VIEW COMPARISON:  Yesterday FINDINGS: Bilateral chest tube in place. Small right apical pneumothorax that is stable to decreased. No visible left-sided pneumothorax. Normal heart size. Mild atelectasis at the bases. No pleural fluid. IMPRESSION: Bilateral chest tube with small right apical pneumothorax that is stable to decreased. Electronically Signed   By: Marnee Spring M.D.   On: 08/29/2018 08:32   Dg Chest Port 1 View  Result Date: 08/28/2018 CLINICAL DATA:  BILATERAL pneumothorax post fall. Continued surveillance. EXAM: PORTABLE CHEST 1 VIEW COMPARISON:  08/27/2018 FINDINGS: Pigtail chest 2 catheters are noted on the RIGHT and LEFT. The LEFT lung is well re-expanded. The RIGHT lung demonstrates a small apically 5% or less pneumothorax. LEFT basilar atelectasis. Small BILATERAL effusions. IMPRESSION: Stable chest.  Small RIGHT apical pneumothorax is unchanged. Electronically Signed   By: Elsie Stain M.D.   On: 08/28/2018 10:03    Anti-infectives: Anti-infectives (From admission, onward)   None       Assessment/Plan Fall through 2nd floor R posterior ribs 9-12, bilateral HPTX- pain control, IS, pulm toilet - repeat CXR this AM with small R apical PTX that is stable to decreased - L CT removed, keep R CT to WS until output <100 cc in 24h  FEN: reg diet VTE: SQ heparin ID: no current abx  Dispo: Continue  PT/OT. Pain control. Continue R CT on WS.  Will update patient's husband Loraine LericheMark.  LOS: 4 days     Wells GuilesKelly Rayburn , [facility] 08/30/2018, 8:09 AM Pager: [phone]

## 2018-08-31 ENCOUNTER — Inpatient Hospital Stay (HOSPITAL_COMMUNITY): Payer: BLUE CROSS/BLUE SHIELD

## 2018-08-31 MED ORDER — METHOCARBAMOL 750 MG PO TABS: 750.0000 mg | ORAL_TABLET | Freq: Three times a day (TID) | ORAL | 0 refills | Status: DC

## 2018-08-31 MED ORDER — TRAMADOL HCL 50 MG PO TABS: 50.0000 mg | ORAL_TABLET | Freq: Four times a day (QID) | ORAL | 0 refills | Status: DC | PRN

## 2018-08-31 MED ORDER — ACETAMINOPHEN 500 MG PO TABS: 1000.0000 mg | ORAL_TABLET | Freq: Four times a day (QID) | ORAL | Status: AC | PRN

## 2018-08-31 MED ORDER — IBUPROFEN 600 MG PO TABS: 600.0000 mg | ORAL_TABLET | Freq: Four times a day (QID) | ORAL | Status: DC

## 2018-08-31 NOTE — Discharge Summary (Signed)
Physician Discharge Summary  Patient ID: [name] MRN: [id number] DOB/AGE: [birth year] 58 y.o.  Admit date: 08/26/2018 Discharge date: 08/31/2018  Discharge Diagnoses Fall through 2nd floor Right posterior ribs 9-12 Bilateral HPTX  Consultants None  Procedures Placement of bilateral chest tubes - Dr. Cliffton Asters 08/26/18  HPI: 58 year old female s/p fall through 2nd floor this afternoon - reports she fell approximately 14 feet. Laundry room on 2nd floor has been worked on by her husband - flooring partially up and area was covered with a board where there was a large hole in floor. She fell through this to her first floor. Possible brief LOC. Complained of pain in her back and posterior chest walls. Denied any pain in her head, neck, abdomen, pelvis, left upper extremity or either lower extremity. She reported mild right upper arm discomfort/ache. Work up in the ED revealed right sided rib fractures with small bilateral pneumothoraces.  Hospital Course: Chest tubes placed in the ED. Patient admitted to the trauma service for chest tube management and pain control. Left chest tube was removed 5/5 and follow up CXR was stable. Right apical pneumothorax seen on follow up images but remained stable. Right chest tube removed 5/7 and follow up CXR remained stable. On 08/31/18 patient was tolerating a diet, voiding appropriately, VSS, pain well controlled and overall felt stable for discharge home with her husband. Follow up is as outlined below, and patient knows to call with questions or concerns.     Allergies as of 08/31/2018      Reactions   Betadine Antibiotic-moisturize [bacitracin-polymyxin B] Itching, Swelling, Rash      Medication List    STOP taking these medications   lidocaine 5 % ointment Commonly known as:  XYLOCAINE   meloxicam 15 MG tablet Commonly known as:  MOBIC     TAKE these medications   acetaminophen 500 MG tablet Commonly known as:  TYLENOL Take 2 tablets (1,000 mg  total) by mouth every 6 (six) hours as needed for mild pain. What changed:  reasons to take this   amitriptyline 50 MG tablet Commonly known as:  ELAVIL Take 50-100 mg by mouth at bedtime.   Elmiron 100 MG capsule Generic drug:  pentosan polysulfate Take 100 mg by mouth 2 (two) times a day.   gabapentin 300 MG capsule Commonly known as:  NEURONTIN Take 300 mg by mouth See admin instructions. 300mg  in the morning and afternoon   gabapentin 100 MG capsule Commonly known as:  NEURONTIN Take 100 mg by mouth at bedtime.   ibuprofen 600 MG tablet Commonly known as:  ADVIL Take 1 tablet (600 mg total) by mouth 4 (four) times daily. What changed:    medication strength  how much to take  when to take this  reasons to take this   methocarbamol 750 MG tablet Commonly known as:  ROBAXIN Take 1 tablet (750 mg total) by mouth 3 (three) times daily.   Premarin vaginal cream Generic drug:  conjugated estrogens Place 1 application vaginally every other day.   traMADol 50 MG tablet Commonly known as:  ULTRAM Take 1 tablet (50 mg total) by mouth every 6 (six) hours as needed for moderate pain or severe pain (50 mg for moderate, 100 mg for severe).   venlafaxine XR 75 MG 24 hr capsule Commonly known as:  EFFEXOR-XR Take 150 mg by mouth at bedtime.        Follow-up Information    [facility]. Go on 09/12/2018.  Why:  Follow up appointment scheduled for 10:00 AM. Please arrive 30 min prior to appointment time. Bring photo ID and insurance information.  Contact information: [address] [place] [id number] [phone]       [facility] Go on 09/11/2018.   Why:  Go to radiology the day prior to trauma clinic appointment. If they have any issue finding order, please have them call [facility].  Contact information: [address] [place] [id number] [phone]            Signed: Wells GuilesKelly Rayburn , [facility] 08/31/2018, 1:30 PM Pager: [phone]

## 2018-08-31 NOTE — Progress Notes (Signed)
[facility] Progress Note     Subjective: CC: pain Patient having some pain this AM, but has not had any pain medicine in a while. Hopeful to go home this afternoon. Some redness and pain in L antecubital but it has improved from yesterday.   Objective: Vital signs in last 24 hours: Temp:  [97.9 F (36.6 C)-98.7 F (37.1 C)] 98.6 F (37 C) (05/07 0839) Pulse Rate:  [64-75] 66 (05/07 0839) Resp:  [16-18] 16 (05/07 0839) BP: (130-146)/(70-89) 132/88 (05/07 0839) SpO2:  [95 %-98 %] 95 % (05/07 0839) Last BM Date: 08/30/18  Intake/Output from previous day: 05/06 0701 - 05/07 0700 In: 240 [P.O.:240] Out: 20 [Chest Tube:20] Intake/Output this shift: No intake/output data recorded.  PE: Gen: Alert, NAD, pleasant Card: Regular rate and rhythm, pedal pulses 2+ BL Pulm: Normal effort, clear to auscultation bilaterally, bilateral chest tubes with SS drainage Abd: Soft, non-tender, non-distended,+BS Skin: warm and dry, some redness and palpable chord in L antecubital  Psych: A&Ox3  Lab Results:  Recent Labs    08/28/18 0929  WBC 5.9  HGB 12.7  HCT 39.3  PLT 157   BMET Recent Labs    08/28/18 0929  NA 139  K 4.4  CL 105  CO2 26  GLUCOSE 91  BUN 9  CREATININE 0.81  CALCIUM 8.6*   PT/INR No results for input(s): LABPROT, INR in the last 72 hours. CMP     Component Value Date/Time   NA 139 08/28/2018 0929   K 4.4 08/28/2018 0929   CL 105 08/28/2018 0929   CO2 26 08/28/2018 0929   GLUCOSE 91 08/28/2018 0929   BUN 9 08/28/2018 0929   CREATININE 0.81 08/28/2018 0929   CALCIUM 8.6 (L) 08/28/2018 0929   PROT 7.1 08/26/2018 1511   ALBUMIN 4.0 08/26/2018 1511   AST 23 08/26/2018 1511   ALT 16 08/26/2018 1511   ALKPHOS 86 08/26/2018 1511   BILITOT 0.4 08/26/2018 1511   GFRNONAA >60 08/28/2018 0929   GFRAA >60 08/28/2018 0929   Lipase  No results found for: LIPASE     Studies/Results: Dg Chest Port 1 View  Result Date:  08/31/2018 CLINICAL DATA:  Pneumothorax on the right EXAM: PORTABLE CHEST 1 VIEW COMPARISON:  Yesterday FINDINGS: Chest tube on the right. Unchanged small right apical pneumothorax measuring 1.5 interspaces posteriorly. Haziness at the left more than right base attributed atelectasis. Rib fractures known from CT. Normal heart size IMPRESSION: 1. Unchanged small right apical pneumothorax. 2. Atelectasis at the bases. Electronically Signed   By: Marnee SpringJonathon  Watts M.D.   On: 08/31/2018 08:41   Dg Chest Port 1 View  Result Date: 08/30/2018 CLINICAL DATA:  Trauma with bilateral pneumothoraces. EXAM: PORTABLE CHEST 1 VIEW COMPARISON:  08/29/2018 FINDINGS: The heart size and mediastinal contours are stable. After left pigtail chest tube removal, there is no evidence of pneumothorax. Right-sided chest tube remains with tiny apically pneumothorax present of less than 5% volume. Stable atelectasis of the left lower lobe with probable small left pleural effusion. Bilateral rib fractures again noted. IMPRESSION: No left-sided pneumothorax after chest tube removal. Tiny right apically pneumothorax remains with stable positioning of pigtail chest tube. Electronically Signed   By: Irish LackGlenn  Yamagata M.D.   On: 08/30/2018 08:09    Anti-infectives: Anti-infectives (From admission, onward)   None       Assessment/Plan Fall through 2nd floor R posterior ribs 9-12, bilateral HPTX- pain control, IS, pulm toilet - repeat CXR this AMwith small R  apical PTX that is stable - RN to remove R chest tube this AM - L CT removed 5/5  FEN: reg diet VTE: SQ heparin ID: no current abx  Dispo: Home later today if follow up CXR stable  Will update patient's husband Loraine Leriche.  LOS: 5 days    Wells Guiles , [facility] 08/31/2018, 8:58 AM Pager: [phone]

## 2018-08-31 NOTE — Progress Notes (Addendum)
Occupational Therapy Treatment Patient Details Name: [name] MRN: [id number] DOB: 1960-08-12 Today's Date: 08/31/2018    History of present illness 58yoF s/p fall through 2nd floor with resulting traumatic pneumothorax, multiple rib fxs (nondisplaced right posterior twelfth rib fracture and mildly displaced right posterior ninth, tenth and eleventh rib   OT comments  Pt presents supine in bed, just recently with chest tube removed and awaiting chest xray. Deferred mobility until after completion of xray (pt also request to defer, noted has been performing at supervision level and without significant difficulty) but pt with questions about ADL/mobility after return home. Discussion held and educated pt in general safety and compensatory strategies for completing ADL to decrease pain levels with task completion. Educated in activity progression for resuming daily activities as pt reports typically very active at home. Pt reports she has good family support who are able to assist with ADL/iADL PRN. Feel POC remains appropriate at this time. Will continue to follow acutely.    Follow Up Recommendations  No OT follow up;Supervision/Assistance - 24 hour    Equipment Recommendations  3 in 1 bedside commode          Precautions / Restrictions Precautions Precautions: Fall Precaution Comments: chest tube now removed Restrictions Weight Bearing Restrictions: No       Mobility Bed Mobility                  Transfers                 General transfer comment: pt request to defer until after chest x-ray post chest tube removal     Balance                                           ADL either performed or assessed with clinical judgement   ADL Overall ADL's : Needs assistance/impaired                                       General ADL Comments: discussed safety after return home in regards to ADL and mobility completion including  having family/spouse assist with LB ADL given pain levels; educated in activity progression, energy conservation and general activities for increased endurance/strength. educated to use seat in shower for increased safety with bathing task and pt verbalizing understanding, reports will have good support at home. mobility deferred as pt awaiting chest xray after chest tube removal     Vision       Perception     Praxis      Cognition Arousal/Alertness: Awake/alert Behavior During Therapy: WFL for tasks assessed/performed Overall Cognitive Status: Within Functional Limits for tasks assessed                                          Exercises     Shoulder Instructions       General Comments      Pertinent Vitals/ Pain       Pain Assessment: No/denies pain  Home Living  Prior Functioning/Environment              Frequency  Min 2X/week        Progress Toward Goals  OT Goals(current goals can now be found in the care plan section)  Progress towards OT goals: Progressing toward goals  Acute Rehab OT Goals Patient Stated Goal: home today OT Goal Formulation: With patient Time For Goal Achievement: 09/11/18 Potential to Achieve Goals: Good  Plan Discharge plan remains appropriate    Co-evaluation                 AM-PAC OT "6 Clicks" Daily Activity     Outcome Measure   Help from another person eating meals?: None Help from another person taking care of personal grooming?: A Little Help from another person toileting, which includes using toliet, bedpan, or urinal?: A Little Help from another person bathing (including washing, rinsing, drying)?: A Lot Help from another person to put on and taking off regular upper body clothing?: A Little Help from another person to put on and taking off regular lower body clothing?: A Lot 6 Click Score: 17    End of Session    OT Visit  Diagnosis: Other abnormalities of gait and mobility (R26.89);Muscle weakness (generalized) (M62.81);Pain Pain - part of body: (ribcage)   Activity Tolerance Patient tolerated treatment well   Patient Left in bed;with call bell/phone within reach   Nurse Communication Mobility status        Time: 1610-9604 OT Time Calculation (min): 12 min  Charges: OT General Charges $OT Visit: 1 Visit  Marcy Siren, OT Supplemental Rehabilitation Services Pager [phone] Office [phone]    Orlando Penner 08/31/2018, 11:56 AM

## 2018-08-31 NOTE — Progress Notes (Signed)
Physical Therapy Treatment Patient Details Name: Kaylee MadeiraRhonda Kent MRN: [id number] DOB: 1961-04-19 Today's Date: 08/31/2018    History of Present Illness 58yoF s/p fall through 2nd floor with resulting traumatic pneumothorax, multiple rib fxs (nondisplaced right posterior twelfth rib fracture and mildly displaced right posterior ninth, tenth and eleventh rib    PT Comments    Session focused on stair training prior to discharge home. Pt negotiated 5 steps with right railing to simulate home environment. Demonstrated good technique with minimal cueing. Reviewed splinting technique for rib fractures for comfort and pain control. Ambulating hallway distances with walker and no physical assistance. Pt discharged from the hospital.     Follow Up Recommendations  No PT follow up;Supervision for mobility/OOB     Equipment Recommendations  Rolling walker with 5" wheels    Recommendations for Other Services       Precautions / Restrictions Precautions Precautions: Fall Precaution Comments: chest tube now removed Restrictions Weight Bearing Restrictions: No    Mobility  Bed Mobility Overal bed mobility: Modified Independent                Transfers Overall transfer level: Modified independent               General transfer comment: pt request to defer until after chest x-ray post chest tube removal   Ambulation/Gait Ambulation/Gait assistance: Modified independent (Device/Increase time) Gait Distance (Feet): 100 Feet Assistive device: Rolling walker (2 wheeled) Gait Pattern/deviations: Step-through pattern;Decreased stride length     General Gait Details: pt with good posture and adequate gait speed with use of walker   Stairs Stairs: Yes Stairs assistance: Min guard Stair Management: One rail Right Number of Stairs: 5 General stair comments: cues for sequencing. step by step technique   Wheelchair Mobility    Modified Rankin (Stroke Patients Only)       Balance Overall balance assessment: Needs assistance Sitting-balance support: Feet supported Sitting balance-Leahy Scale: Good     Standing balance support: Bilateral upper extremity supported;During functional activity Standing balance-Leahy Scale: Fair                              Cognition Arousal/Alertness: Awake/alert Behavior During Therapy: WFL for tasks assessed/performed Overall Cognitive Status: Within Functional Limits for tasks assessed                                        Exercises      General Comments        Pertinent Vitals/Pain Pain Assessment: Faces Faces Pain Scale: Hurts little more Pain Location: ribs Pain Descriptors / Indicators: Pressure    Home Living                      Prior Function            PT Goals (current goals can now be found in the care plan section) Acute Rehab PT Goals Patient Stated Goal: to go home PT Goal Formulation: With patient Time For Goal Achievement: 09/10/18 Potential to Achieve Goals: Good Progress towards PT goals: Progressing toward goals    Frequency    Min 5X/week      PT Plan Current plan remains appropriate    Co-evaluation              AM-PAC PT "6 Clicks" Mobility   Outcome  Measure  Help needed turning from your back to your side while in a flat bed without using bedrails?: A Little Help needed moving from lying on your back to sitting on the side of a flat bed without using bedrails?: A Little Help needed moving to and from a bed to a chair (including a wheelchair)?: A Little Help needed standing up from a chair using your arms (e.g., wheelchair or bedside chair)?: A Little Help needed to walk in hospital room?: A Little Help needed climbing 3-5 steps with a railing? : A Lot 6 Click Score: 17    End of Session Equipment Utilized During Treatment: Gait belt Activity Tolerance: Patient tolerated treatment well Patient left: with call bell/phone  within reach;in bed Nurse Communication: Mobility status PT Visit Diagnosis: Difficulty in walking, not elsewhere classified (R26.2);Pain Pain - Right/Left: (ribs and back)     Time: 3709-6438 PT Time Calculation (min) (ACUTE ONLY): 9 min  Charges:  $Therapeutic Activity: 8-22 mins                    Laurina Bustle, PT, DPT Acute Rehabilitation Services Pager [phone] Office [phone]   Vanetta Mulders 08/31/2018, 3:40 PM

## 2018-08-31 NOTE — Discharge Instructions (Signed)
Pneumothorax °A pneumothorax is commonly called a collapsed lung. It is a condition in which air leaks from a lung and builds up between the thin layer of tissue that covers the lungs (visceral pleura) and the interior wall of the chest cavity (parietal pleura). The air gets trapped outside the lung, between the lung and the chest wall (pleural space). The air takes up space and prevents the lung from fully expanding. °This condition sometimes occurs suddenly with no apparent cause. The buildup of air may be small or large. A small pneumothorax may go away on its own. A large pneumothorax will require treatment and hospitalization. °What are the causes? °This condition may be caused by: °· Trauma and injury to the chest wall. °· Surgery and other medical procedures. °· A complication of an underlying lung problem, especially chronic obstructive pulmonary disease (COPD) or emphysema. °Sometimes the cause of this condition is not known. °What increases the risk? °You are more likely to develop this condition if: °· You have an underlying lung problem. °· You smoke. °· You are 20-40 years old, female, tall, and underweight. °· You have a personal or family history of pneumothorax. °· You have an eating disorder (anorexia nervosa). °This condition can also happen quickly, even in people with no history of lung problems. °What are the signs or symptoms? °Sometimes a pneumothorax will have no symptoms. When symptoms are present, they can include: °· Chest pain. °· Shortness of breath. °· Increased rate of breathing. °· Bluish color to your lips or skin (cyanosis). °How is this diagnosed? °This condition may be diagnosed by: °· A medical history and physical exam. °· A chest X-ray, chest CT scan, or ultrasound. °How is this treated? °Treatment depends on how severe your condition is. The goal of treatment is to remove the extra air and allow your lung to expand back to its normal size. °· For a small pneumothorax: °? No  treatment may be needed. °? Extra oxygen is sometimes used to make it go away more quickly. °· For a large pneumothorax or a pneumothorax that is causing symptoms, a procedure is done to drain the air from your lungs. To do this, a health care provider may use: °? A needle with a syringe. This is used to suck air from a pleural space where no additional leakage is taking place. °? A chest tube. This is used to suck air where there is ongoing leakage into the pleural space. The chest tube may need to remain in place for several days until the air leak has healed. °· In more severe cases, surgery may be needed to repair the damage that is causing the leak. °· If you have multiple pneumothorax episodes or have an air leak that will not heal, a procedure called a pleurodesis may be done. A medicine is placed in the pleural space to irritate the tissues around the lung so that the lung will stick to the chest wall, seal any leaks, and stop any buildup of air in that space. °If you have an underlying lung problem, severe symptoms, or a large pneumothorax you will usually need to stay in the hospital. °Follow these instructions at home: °Lifestyle °· Do not use any products that contain nicotine or tobacco, such as cigarettes and e-cigarettes. These are major risk factors in pneumothorax. If you need help quitting, ask your health care provider. °· Do not lift anything that is heavier than 10 lb (4.5 kg), or the limit that your health care   provider tells you, until he or she says that it is safe.  Avoid activities that take a lot of effort (strenuous) for as long as told by your health care provider.  Return to your normal activities as told by your health care provider. Ask your health care provider what activities are safe for you.  Do not fly in an airplane or scuba dive until your health care provider says it is okay. General instructions  Take over-the-counter and prescription medicines only as told by your  health care provider.  If a cough or pain makes it difficult for you to sleep at night, try sleeping in a semi-upright position in a recliner or by using 2 or 3 pillows.  If you had a chest tube and it was removed, ask your health care provider when you can remove the bandage (dressing). While the dressing is in place, do not allow it to get wet.  Keep all follow-up visits as told by your health care provider. This is important. Contact a health care provider if:  You cough up thick mucus (sputum) that is yellow or green in color.  You were treated with a chest tube, and you have redness, increasing pain, or discharge at the site where it was placed. Get help right away if:  You have increasing chest pain or shortness of breath.  You have a cough that will not go away.  You begin coughing up blood.  You have pain that is getting worse or is not controlled with medicines.  The site where your chest tube was located opens up.  You feel air coming out of the site where the chest tube was placed.  You have a fever or persistent symptoms for more than 2-3 days.  You have a fever and your symptoms suddenly get worse. These symptoms may represent a serious problem that is an emergency. Do not wait to see if the symptoms will go away. Get medical help right away. Call your local emergency services (911 in the [place] Do not drive yourself to the hospital. Summary  A pneumothorax, commonly called a collapsed lung, is a condition in which air leaks from a lung and gets trapped between the lung and the chest wall (pleural space).  The buildup of air may be small or large. A small pneumothorax may go away on its own. A large pneumothorax will require treatment and hospitalization.  Treatment for this condition depends on how severe the pneumothorax is. The goal of treatment is to remove the extra air and allow the lung to expand back to its normal size. This information is not intended to  replace advice given to you by your health care provider. Make sure you discuss any questions you have with your health care provider. Document Released: 04/12/2005 Document Revised: 03/21/2017 Document Reviewed: 03/21/2017 Elsevier Interactive Patient Education  2019 [employer]   Rib Fracture  A rib fracture is a break or crack in one of the bones of the ribs. The ribs are like a cage that goes around your upper chest. A broken or cracked rib is often painful, but most do not cause other problems. Most rib fractures usually heal on their own in 1-3 months. Follow these instructions at home: Managing pain, stiffness, and swelling  If directed, apply ice to the injured area. ? Put ice in a plastic bag. ? Place a towel between your skin and the bag. ? Leave the ice on for 20 minutes, 2-3 times a day.  Take over-the-counter and prescription medicines only as told by your doctor. Activity  Avoid activities that cause pain to the injured area. Protect your injured area.  Slowly increase activity as told by your doctor. General instructions  Do deep breathing as told by your doctor. You may be told to: ? Take deep breaths many times a day. ? Cough many times a day while hugging a pillow. ? Use a device (incentive spirometer) to do deep breathing many times a day.  Drink enough fluid to keep your pee (urine) clear or pale yellow.  Do not wear a rib belt or binder. These do not allow you to breathe deeply.  Keep all follow-up visits as told by your doctor. This is important. Contact a doctor if:  You have a fever. Get help right away if:  You have trouble breathing.  You are short of breath.  You cannot stop coughing.  You cough up thick or bloody spit (sputum).  You feel sick to your stomach (nauseous), throw up (vomit), or have belly (abdominal) pain.  Your pain gets worse and medicine does not help. Summary  A rib fracture is a break or crack in one of the bones of  the ribs.  Apply ice to the injured area and take medicines for pain as told by your doctor.  Take deep breaths and cough many times a day. Hug a pillow every time you cough. This information is not intended to replace advice given to you by your health care provider. Make sure you discuss any questions you have with your health care provider. Document Released: 01/20/2008 Document Revised: 07/13/2016 Document Reviewed: 07/13/2016 Elsevier Interactive Patient Education  2019 [employer]

## 2018-08-31 NOTE — TOC Transition Note (Signed)
Transition of Care [facility]) - CM/SW Discharge Note   Patient Details  Name: [name] MRN: [id number] Date of Birth: 07-Feb-1961  Transition of Care [facility]) CM/SW Contact:  Glennon Mac, RN Phone Number: 08/31/2018, 4:57 PM   Clinical Narrative:  58yoF s/p fall through 2nd floor with resulting traumatic pneumothorax, multiple rib fxs (nondisplaced right posterior twelfth rib fracture and mildly displaced right posterior ninth, tenth and eleventh rib.  Pt medically stable for discharge home with family to assist.  PT/OT recommending no OP follow up.       Final next level of care: Home/Self Care Barriers to Discharge: No Barriers Identified                       Discharge Plan and Services   Discharge Planning Services: CM Consult                                     Readmission Risk Interventions No flowsheet data found.   Quintella Baton, RN, BSN  Trauma/Neuro ICU Case Manager [phone]

## 2018-08-31 NOTE — Progress Notes (Signed)
Pt educated and questions answered. No prescriptions to print or equipment to deliver. Pt taken to care with all belongings.

## 2018-09-01 ENCOUNTER — Encounter (HOSPITAL_COMMUNITY): Payer: Self-pay | Admitting: *Deleted

## 2018-09-11 ENCOUNTER — Other Ambulatory Visit: Payer: Self-pay | Admitting: General Surgery

## 2018-09-11 ENCOUNTER — Other Ambulatory Visit: Payer: Self-pay

## 2018-09-11 ENCOUNTER — Ambulatory Visit
Admission: RE | Admit: 2018-09-11 | Discharge: 2018-09-11 | Disposition: A | Discharge status: Home / Self Care | Payer: BLUE CROSS/BLUE SHIELD | Source: Ambulatory Visit | Attending: General Surgery | Admitting: General Surgery

## 2018-09-11 DIAGNOSIS — J9383 Other pneumothorax: Secondary | ICD-10-CM

## 2018-12-28 ENCOUNTER — Other Ambulatory Visit: Payer: Self-pay

## 2018-12-28 DIAGNOSIS — Z20822 Contact with and (suspected) exposure to covid-19: Secondary | ICD-10-CM

## 2018-12-29 LAB — NOVEL CORONAVIRUS, NAA: SARS-CoV-2, NAA: NOT DETECTED

## 2019-01-02 ENCOUNTER — Other Ambulatory Visit: Payer: Self-pay | Admitting: Registered"

## 2019-01-02 DIAGNOSIS — Z20822 Contact with and (suspected) exposure to covid-19: Secondary | ICD-10-CM

## 2019-01-04 LAB — NOVEL CORONAVIRUS, NAA: SARS-CoV-2, NAA: NOT DETECTED

## 2019-01-18 ENCOUNTER — Other Ambulatory Visit: Payer: Self-pay

## 2019-01-18 ENCOUNTER — Encounter (HOSPITAL_COMMUNITY): Payer: Self-pay | Admitting: *Deleted

## 2019-01-18 ENCOUNTER — Emergency Department (HOSPITAL_COMMUNITY)
Admission: EM | Admit: 2019-01-18 | Discharge: 2019-01-18 | Disposition: A | Discharge status: Home / Self Care | Payer: BLUE CROSS/BLUE SHIELD | Attending: Emergency Medicine | Admitting: Emergency Medicine

## 2019-01-18 DIAGNOSIS — N189 Chronic kidney disease, unspecified: Secondary | ICD-10-CM | POA: Diagnosis not present

## 2019-01-18 DIAGNOSIS — B373 Candidiasis of vulva and vagina: Secondary | ICD-10-CM | POA: Diagnosis not present

## 2019-01-18 DIAGNOSIS — N898 Other specified noninflammatory disorders of vagina: Secondary | ICD-10-CM | POA: Diagnosis present

## 2019-01-18 DIAGNOSIS — Z79899 Other long term (current) drug therapy: Secondary | ICD-10-CM | POA: Diagnosis not present

## 2019-01-18 DIAGNOSIS — B3731 Acute candidiasis of vulva and vagina: Secondary | ICD-10-CM

## 2019-01-18 LAB — URINALYSIS, ROUTINE W REFLEX MICROSCOPIC
Bilirubin Urine: NEGATIVE
Glucose, UA: NEGATIVE mg/dL
Hgb urine dipstick: NEGATIVE
Ketones, ur: NEGATIVE mg/dL
Leukocytes,Ua: NEGATIVE
Nitrite: NEGATIVE
Protein, ur: NEGATIVE mg/dL
Specific Gravity, Urine: 1.016 (ref 1.005–1.030)
pH: 5 (ref 5.0–8.0)

## 2019-01-18 LAB — CBC WITH DIFFERENTIAL/PLATELET
Abs Immature Granulocytes: 0.02 10*3/uL (ref 0.00–0.07)
Basophils Absolute: 0 10*3/uL (ref 0.0–0.1)
Basophils Relative: 1 %
Eosinophils Absolute: 0.1 10*3/uL (ref 0.0–0.5)
Eosinophils Relative: 3 %
HCT: 39.5 % (ref 36.0–46.0)
Hemoglobin: 13.2 g/dL (ref 12.0–15.0)
Immature Granulocytes: 1 %
Lymphocytes Relative: 47 %
Lymphs Abs: 1.7 10*3/uL (ref 0.7–4.0)
MCH: 31.4 pg (ref 26.0–34.0)
MCHC: 33.4 g/dL (ref 30.0–36.0)
MCV: 94 fL (ref 80.0–100.0)
Monocytes Absolute: 0.4 10*3/uL (ref 0.1–1.0)
Monocytes Relative: 10 %
Neutro Abs: 1.4 10*3/uL — ABNORMAL LOW (ref 1.7–7.7)
Neutrophils Relative %: 38 %
Platelets: 222 10*3/uL (ref 150–400)
RBC: 4.2 MIL/uL (ref 3.87–5.11)
RDW: 12 % (ref 11.5–15.5)
WBC: 3.7 10*3/uL — ABNORMAL LOW (ref 4.0–10.5)
nRBC: 0 % (ref 0.0–0.2)

## 2019-01-18 LAB — COMPREHENSIVE METABOLIC PANEL
ALT: 18 U/L (ref 0–44)
AST: 21 U/L (ref 15–41)
Albumin: 3.5 g/dL (ref 3.5–5.0)
Alkaline Phosphatase: 85 U/L (ref 38–126)
Anion gap: 10 (ref 5–15)
BUN: 18 mg/dL (ref 6–20)
CO2: 25 mmol/L (ref 22–32)
Calcium: 9.1 mg/dL (ref 8.9–10.3)
Chloride: 104 mmol/L (ref 98–111)
Creatinine, Ser: 0.92 mg/dL (ref 0.44–1.00)
GFR calc Af Amer: >60 mL/min (ref >60)
GFR calc non Af Amer: >60 mL/min (ref >60)
Glucose, Bld: 122 mg/dL — ABNORMAL HIGH (ref 70–99)
Potassium: 3.9 mmol/L (ref 3.5–5.1)
Sodium: 139 mmol/L (ref 135–145)
Total Bilirubin: 0.5 mg/dL (ref 0.3–1.2)
Total Protein: 6.9 g/dL (ref 6.5–8.1)

## 2019-01-18 LAB — WET PREP, GENITAL
Clue Cells Wet Prep HPF POC: NONE SEEN
Sperm: NONE SEEN
Trich, Wet Prep: NONE SEEN
Yeast Wet Prep HPF POC: NONE SEEN

## 2019-01-18 MED ORDER — FLUCONAZOLE 150 MG PO TABS: 150.0000 mg | ORAL_TABLET | Freq: Every day | ORAL | 0 refills | Status: AC

## 2019-01-18 NOTE — ED Provider Notes (Signed)
[facility] EMERGENCY DEPARTMENT Provider Note   CSN: [id number] Arrival date & time: 01/18/19  5427     History   Chief Complaint Chief Complaint  Patient presents with  . Vaginitis    HPI [name] is a 58 y.o. female resents to the ED today complaining of gradual onset, constant, vaginal discharge x3 to 4 days.  Patient believes she has a yeast infection.  She states that she was recently exposed to a COVID positive patient and tested positive herself on 9/21 at her PCPs office.  And attempted to discuss her yeast infection at that time but they wanted to test her for COVID positive with concern that if they prescribe Diflucan it could elevate her liver enzymes with already potentially increase in liver enzymes with COVID positive status.  Patient called yesterday requesting Diflucan but they told her she needed to wait and self quarantine and they will check her liver enzymes after her 10-day quarantine.  She reports that she tried over-the-counter Monistat last night but then began having severe burning sensation which prompted her to come to the ED today.  She reports that she has had chronic yeast infections in the past and Diflucan always works for her.  Denies fever, chills, abdominal pain, nausea, vomiting, pelvic pain, vaginal bleeding, any other associated symptoms.        Past Medical History:  Diagnosis Date  . Arthritis   . Chronic kidney disease   . H/O neck surgery   . Interstitial cystitis     Patient Active Problem List   Diagnosis Date Noted  . Pneumothorax, traumatic 08/26/2018  . Urinary retention with incomplete bladder emptying 02/28/2018    Past Surgical History:  Procedure Laterality Date  . ABDOMINAL HYSTERECTOMY    . neck durgery       OB History   No obstetric history on file.      Home Medications    Prior to Admission medications   Medication Sig Start Date End Date Taking? Authorizing Provider   acetaminophen (TYLENOL) 500 MG tablet Take 2 tablets (1,000 mg total) by mouth every 6 (six) hours as needed for mild pain. 08/31/18   Rayburn, Alphonsus Sias, PA-C  amitriptyline (ELAVIL) 25 MG tablet Take 12.5 mg by mouth at bedtime.    [provider]  amitriptyline (ELAVIL) 50 MG tablet Take 50-100 mg by mouth at bedtime. 07/27/18   [provider]  diazepam (VALIUM) 5 MG tablet Take 1 tablet (5 mg total) by mouth every 8 (eight) hours as needed for muscle spasms. 03/01/18   Tressie Stalker, MD  docusate sodium (COLACE) 100 MG capsule Take 1 capsule (100 mg total) by mouth 2 (two) times daily. 03/01/18   Tressie Stalker, MD  ELMIRON 100 MG capsule Take 100 mg by mouth 2 (two) times a day.  07/27/18   [provider]  fluconazole (DIFLUCAN) 150 MG tablet Take 1 tablet (150 mg total) by mouth daily for 3 doses. Take 1 tablet on day 1. Take 1 tablet on day 4. Take 1 tablet on day 8. 01/18/19 01/21/19  Tanda Rockers, PA-C  gabapentin (NEURONTIN) 100 MG capsule Take 100 mg by mouth at bedtime.    [provider]  gabapentin (NEURONTIN) 100 MG capsule Take 100 mg by mouth at bedtime.  08/23/18   [provider]  gabapentin (NEURONTIN) 300 MG capsule Take 300 mg by mouth See admin instructions. 300mg  in the morning and afternoon 08/09/18   [provider]  ibuprofen (ADVIL) 600 MG tablet Take 1 tablet (600 mg total) by mouth 4 (four) times daily. 08/31/18   Rayburn, Alphonsus SiasKelly A, PA-C  methocarbamol (ROBAXIN) 750 MG tablet Take 1 tablet (750 mg total) by mouth 3 (three) times daily. 08/31/18   Rayburn, Alphonsus SiasKelly A, PA-C  oxyCODONE (OXY IR/ROXICODONE) 5 MG immediate release tablet Take 1 tablet (5 mg total) by mouth every 4 (four) hours as needed for moderate pain. 03/01/18   Tressie StalkerJenkins, Jeffrey, MD  pentosan polysulfate (ELMIRON) 100 MG capsule Take 100 mg by mouth 3 (three) times daily.    [provider]  PREMARIN vaginal cream Place 1 application vaginally every other  day. 07/17/18   [provider]  traMADol (ULTRAM) 50 MG tablet Take 1 tablet (50 mg total) by mouth every 6 (six) hours as needed for moderate pain or severe pain (50 mg for moderate, 100 mg for severe). 08/31/18   Rayburn, Alphonsus SiasKelly A, PA-C  venlafaxine XR (EFFEXOR-XR) 150 MG 24 hr capsule Take 150 mg by mouth daily with breakfast.    [provider]  venlafaxine XR (EFFEXOR-XR) 75 MG 24 hr capsule Take 150 mg by mouth at bedtime.  06/29/18   [provider]    Family History No family history on file.  Social History Social History   Tobacco Use  . Smoking status: Never Smoker  . Smokeless tobacco: Never Used  Substance Use Topics  . Alcohol use: Never    Frequency: Never  . Drug use: Yes    Types: Oxycodone     Allergies   Betadine antibiotic-moisturize [bacitracin-polymyxin b]   Review of Systems Review of Systems  Constitutional: Negative for chills and fever.  Gastrointestinal: Negative for abdominal pain, nausea and vomiting.  Genitourinary: Positive for dysuria and vaginal discharge. Negative for difficulty urinating.     Physical Exam Updated Vital Signs BP (!) 148/66 (BP Location: Left Arm)   Pulse 71   Temp 98 F (36.7 C) (Oral)   Resp 18   Ht 5' 7.75" (1.721 m)   Wt 90.7 kg   SpO2 98%   BMI 30.63 kg/m   Physical Exam Vitals signs and nursing note reviewed.  Constitutional:      Appearance: She is not ill-appearing.  HENT:     Head: Normocephalic and atraumatic.  Eyes:     Conjunctiva/sclera: Conjunctivae normal.  Cardiovascular:     Rate and Rhythm: Normal rate and regular rhythm.     Pulses: Normal pulses.  Pulmonary:     Effort: Pulmonary effort is normal.     Breath sounds: Normal breath sounds. No wheezing, rhonchi or rales.  Abdominal:     Tenderness: There is no abdominal tenderness. There is no right CVA tenderness, left CVA tenderness, guarding or rebound.  Skin:    General: Skin is warm and dry.     Coloration:  Skin is not jaundiced.  Neurological:     Mental Status: She is alert.      ED Treatments / Results  Labs (all labs ordered are listed, but only abnormal results are displayed) Labs Reviewed  WET PREP, GENITAL - Abnormal; Notable for the following components:      Result Value   WBC, Wet Prep HPF POC MANY (*)    All other components within normal limits  COMPREHENSIVE METABOLIC PANEL - Abnormal; Notable for the following components:   Glucose, Bld 122 (*)    All other components within normal limits  CBC WITH DIFFERENTIAL/PLATELET - Abnormal;  Notable for the following components:   WBC 3.7 (*)    Neutro Abs 1.4 (*)    All other components within normal limits  URINALYSIS, ROUTINE W REFLEX MICROSCOPIC - Abnormal; Notable for the following components:   APPearance HAZY (*)    All other components within normal limits  GC/CHLAMYDIA PROBE AMP ([place]) NOT AT [facility]    EKG None  Radiology No results found.  Procedures Procedures (including critical care time)  Medications Ordered in ED Medications - No data to display   Initial Impression / Assessment and Plan / ED Course  I have reviewed the triage vital signs and the nursing notes.  Pertinent labs & imaging results that were available during my care of the patient were reviewed by me and considered in my medical decision making (see chart for details).    58 year old female presents today with complaints of vaginal discharge x3 to 4 days.  Recently tested positive for COVID-19 and PCP wanted to wait on prescribing Diflucan with concern for elevated liver enzymes from COVID status and then worsening with Diflucan prescription.  She reports trying over-the-counter Monistat last night with worsening symptoms which prompted her ED visit today.  She is afebrile without tachycardia or tachypnea.  She has no abdominal tenderness.  No nausea no vomiting.  No pelvic pain.  Will check liver enzymes today and have patient self  swab given she is in a room that does not have a proper bed.  Do not feel she needs a proper pelvic exam today given no complaints of pelvic pain and history of chronic yeast infections with reports that this feels very similar.  If liver enzymes within normal limits will treat if wet prep comes back positive for yeast infection.   Lab work reassuring today.  No elevation in LFTs.  No signs of yeast or BV on wet prep but given she did use Monistat suppository last night this may have caused false negative.  Will treat for yeast infection given patient has chronic yeast infection and reports this feels similar.  She is advised to follow-up with her PCP no improvement after Diflucan.  Advised to continue self isolating given recent COVID-19 positive status.   This note was prepared using Dragon voice recognition software and may include unintentional dictation errors due to the inherent limitations of voice recognition software.       Final Clinical Impressions(s) / ED Diagnoses   Final diagnoses:  Vaginal yeast infection    ED Discharge Orders         Ordered    fluconazole (DIFLUCAN) 150 MG tablet  Daily     01/18/19 1134           Eustaquio Maize, PA-C 01/18/19 [place], [place], DO 01/18/19 1544

## 2019-01-18 NOTE — Discharge Instructions (Signed)
Please take the diflucan as prescribed Follow up with your PCP in regards to your symptoms if no improvement with the medication  Continue to self isolate as previously instructed given your covid 19 status

## 2019-01-18 NOTE — ED Triage Notes (Signed)
Was tested for covid  yest at PCP and test was postive, states she has a yeast infection also has a history of IC  And has been using OTC medication not working. C/o burning unable to sleep

## 2019-01-19 LAB — CERVICOVAGINAL ANCILLARY ONLY
Chlamydia: NEGATIVE
Neisseria Gonorrhea: NEGATIVE

## 2019-03-02 ENCOUNTER — Emergency Department (HOSPITAL_COMMUNITY)
Admission: EM | Admit: 2019-03-02 | Discharge: 2019-03-03 | Disposition: A | Discharge status: Home / Self Care | Payer: BLUE CROSS/BLUE SHIELD | Attending: Emergency Medicine | Admitting: Emergency Medicine

## 2019-03-02 ENCOUNTER — Other Ambulatory Visit: Payer: Self-pay

## 2019-03-02 ENCOUNTER — Encounter (HOSPITAL_COMMUNITY): Payer: Self-pay | Admitting: Emergency Medicine

## 2019-03-02 DIAGNOSIS — R519 Headache, unspecified: Secondary | ICD-10-CM | POA: Insufficient documentation

## 2019-03-02 DIAGNOSIS — N189 Chronic kidney disease, unspecified: Secondary | ICD-10-CM | POA: Insufficient documentation

## 2019-03-02 NOTE — ED Triage Notes (Addendum)
Pt reports she was positive in September for covid, symptoms relieved. Pt reports about 2 weeks ago she started having a sore throat and sinus drainage which has now relieved except a headache remains. Pt reports she was having headaches all summer. Recently seen ENT and had a sinus scope done which may have caused the headache as well. 10/10 pain. Pt also reports nausea with the headache. Attempted otc medications with no relief.

## 2019-03-03 LAB — BASIC METABOLIC PANEL
Anion gap: 11 (ref 5–15)
BUN: 8 mg/dL (ref 6–20)
CO2: 21 mmol/L — ABNORMAL LOW (ref 22–32)
Calcium: 9.6 mg/dL (ref 8.9–10.3)
Chloride: 104 mmol/L (ref 98–111)
Creatinine, Ser: 0.96 mg/dL (ref 0.44–1.00)
GFR calc Af Amer: >60 mL/min (ref >60)
GFR calc non Af Amer: >60 mL/min (ref >60)
Glucose, Bld: 106 mg/dL — ABNORMAL HIGH (ref 70–99)
Potassium: 3.6 mmol/L (ref 3.5–5.1)
Sodium: 136 mmol/L (ref 135–145)

## 2019-03-03 LAB — URINALYSIS, ROUTINE W REFLEX MICROSCOPIC
Bacteria, UA: NONE SEEN
Bilirubin Urine: NEGATIVE
Glucose, UA: NEGATIVE mg/dL
Ketones, ur: NEGATIVE mg/dL
Leukocytes,Ua: NEGATIVE
Nitrite: NEGATIVE
Protein, ur: NEGATIVE mg/dL
Specific Gravity, Urine: 1.013 (ref 1.005–1.030)
pH: 5 (ref 5.0–8.0)

## 2019-03-03 LAB — CBC WITH DIFFERENTIAL/PLATELET
Abs Immature Granulocytes: 0.01 10*3/uL (ref 0.00–0.07)
Basophils Absolute: 0.1 10*3/uL (ref 0.0–0.1)
Basophils Relative: 1 %
Eosinophils Absolute: 0.1 10*3/uL (ref 0.0–0.5)
Eosinophils Relative: 1 %
HCT: 42.8 % (ref 36.0–46.0)
Hemoglobin: 13.9 g/dL (ref 12.0–15.0)
Immature Granulocytes: 0 %
Lymphocytes Relative: 38 %
Lymphs Abs: 2.4 10*3/uL (ref 0.7–4.0)
MCH: 30.8 pg (ref 26.0–34.0)
MCHC: 32.5 g/dL (ref 30.0–36.0)
MCV: 94.9 fL (ref 80.0–100.0)
Monocytes Absolute: 0.5 10*3/uL (ref 0.1–1.0)
Monocytes Relative: 8 %
Neutro Abs: 3.3 10*3/uL (ref 1.7–7.7)
Neutrophils Relative %: 52 %
Platelets: 222 10*3/uL (ref 150–400)
RBC: 4.51 MIL/uL (ref 3.87–5.11)
RDW: 13.3 % (ref 11.5–15.5)
WBC: 6.3 10*3/uL (ref 4.0–10.5)
nRBC: 0 % (ref 0.0–0.2)

## 2019-03-03 MED ORDER — SODIUM CHLORIDE 0.9 % IV BOLUS
1000.0000 mL | Freq: Once | INTRAVENOUS | Status: AC
  Administered 2019-03-03: 1000 mL via INTRAVENOUS

## 2019-03-03 MED ORDER — PROCHLORPERAZINE EDISYLATE 10 MG/2ML IJ SOLN
10.0000 mg | Freq: Once | INTRAMUSCULAR | Status: AC
  Administered 2019-03-03: 10 mg via INTRAVENOUS
  Filled 2019-03-03: qty 2

## 2019-03-03 MED ORDER — DIPHENHYDRAMINE HCL 50 MG/ML IJ SOLN
25.0000 mg | Freq: Once | INTRAMUSCULAR | Status: AC
  Administered 2019-03-03: 25 mg via INTRAVENOUS
  Filled 2019-03-03: qty 1

## 2019-03-03 MED ORDER — KETOROLAC TROMETHAMINE 30 MG/ML IJ SOLN: 30.0000 mg | Freq: Once | INTRAMUSCULAR | Status: DC

## 2019-03-03 NOTE — ED Notes (Signed)
Pt verbalizes understanding of d/c instructions. Pt ambulatory at d/c with all belongings.   

## 2019-03-03 NOTE — Discharge Instructions (Signed)
Your labs and urine test today were normal. You may use tylenol/motrin for headache.  Can use benadryl as nasal spray when needed to see if this will help with sinus symptoms. Follow-up with imaging center to have MRI as scheduled on Tuesday.  You will need to follow-up with Dr. Blenda Nicely once this is finished. Return here for any new/acute changes.
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
## 2020-09-24 ENCOUNTER — Other Ambulatory Visit: Payer: Self-pay | Admitting: Neurosurgery

## 2020-09-24 DIAGNOSIS — M542 Cervicalgia: Secondary | ICD-10-CM

## 2020-09-29 ENCOUNTER — Other Ambulatory Visit: Payer: Self-pay | Admitting: Student

## 2020-09-29 DIAGNOSIS — M542 Cervicalgia: Secondary | ICD-10-CM

## 2020-10-11 ENCOUNTER — Ambulatory Visit
Admission: RE | Admit: 2020-10-11 | Discharge: 2020-10-11 | Disposition: A | Discharge status: Home / Self Care | Payer: BLUE CROSS/BLUE SHIELD | Source: Ambulatory Visit | Attending: Neurosurgery | Admitting: Neurosurgery

## 2020-10-11 ENCOUNTER — Other Ambulatory Visit: Payer: Self-pay

## 2020-10-11 DIAGNOSIS — M542 Cervicalgia: Secondary | ICD-10-CM

## 2020-10-11 MED ORDER — GADOBENATE DIMEGLUMINE 529 MG/ML IV SOLN
18.0000 mL | Freq: Once | INTRAVENOUS | Status: AC | PRN
  Administered 2020-10-11: 18 mL via INTRAVENOUS

## 2020-10-29 ENCOUNTER — Other Ambulatory Visit: Payer: Self-pay | Admitting: Neurosurgery

## 2020-10-29 DIAGNOSIS — M542 Cervicalgia: Secondary | ICD-10-CM

## 2020-11-05 ENCOUNTER — Other Ambulatory Visit: Payer: Self-pay

## 2020-11-05 ENCOUNTER — Ambulatory Visit
Admission: RE | Admit: 2020-11-05 | Discharge: 2020-11-05 | Disposition: A | Discharge status: Home / Self Care | Payer: BC Managed Care – PPO | Source: Ambulatory Visit | Attending: Neurosurgery | Admitting: Neurosurgery

## 2020-11-05 DIAGNOSIS — M542 Cervicalgia: Secondary | ICD-10-CM

## 2020-11-21 ENCOUNTER — Other Ambulatory Visit: Payer: Self-pay | Admitting: Neurosurgery

## 2020-11-24 ENCOUNTER — Other Ambulatory Visit: Payer: Self-pay | Admitting: Neurosurgery

## 2020-12-01 NOTE — Progress Notes (Signed)
Surgical Instructions    Your procedure is scheduled on Wednesday, August 17th, 2022.   Report to [facility] Main Entrance "A" at 12:30 A.M., then check in with the Admitting office.  Call this number if you have problems the morning of surgery:  [phone]   If you have any questions prior to your surgery date call [phone]: Open Monday-Friday 8am-4pm    Remember:  Do not eat or drink after midnight the night before your surgery    Take these medicines the morning of surgery with A SIP OF WATER:  clonazePAM (KLONOPIN)  ELMIRON gabapentin (NEURONTIN) venlafaxine XR (EFFEXOR-XR)  If needed:  acetaminophen (TYLENOL)  pregabalin (LYRICA) tiZANidine (ZANAFLEX)  hydroxypropyl methylcellulose / hypromellose (ISOPTO TEARS / GONIOVISC)  As of today, STOP taking any Aspirin (unless otherwise instructed by your surgeon) Aleve, Naproxen, Ibuprofen, Motrin, Advil, Goody's, BC's, all herbal medications, fish oil, and all vitamins.          Do not wear jewelry or makeup Do not wear lotions, powders, perfumes, or deodorant. Do not shave 48 hours prior to surgery.   Do not bring valuables to the hospital. DO Not wear nail polish, gel polish, artificial nails, or any other type of covering on natural nails including finger and toenails. If patients have artificial nails, gel coating, etc. that need to be removed by a nail salon please have this removed prior to surgery or surgery may need to be canceled/delayed if the surgeon/ anesthesia feels like the patient is unable to be adequately monitored.             [facility] is not responsible for any belongings or valuables.  Do NOT Smoke (Tobacco/Vaping) or drink Alcohol 24 hours prior to your procedure If you use a CPAP at night, you may bring all equipment for your overnight stay.   Contacts, glasses, dentures or bridgework may not be worn into surgery, please bring cases for these belongings   For patients admitted to the hospital,  discharge time will be determined by your treatment team.   Patients discharged the day of surgery will not be allowed to drive home, and someone needs to stay with them for 24 hours.  ONLY 1 SUPPORT PERSON MAY BE PRESENT WHILE YOU ARE IN SURGERY. IF YOU ARE TO BE ADMITTED ONCE YOU ARE IN YOUR ROOM YOU WILL BE ALLOWED TWO (2) VISITORS.  Minor children may have two parents present. Special consideration for safety and communication needs will be reviewed on a case by case basis.  Special instructions:    Oral Hygiene is also important to reduce your risk of infection.  Remember - BRUSH YOUR TEETH THE MORNING OF SURGERY WITH YOUR REGULAR TOOTHPASTE   [facility]- Preparing For Surgery  Before surgery, you can play an important role. Because skin is not sterile, your skin needs to be as free of germs as possible. You can reduce the number of germs on your skin by washing with CHG (chlorahexidine gluconate) Soap before surgery.  CHG is an antiseptic cleaner which kills germs and bonds with the skin to continue killing germs even after washing.     Please do not use if you have an allergy to CHG or antibacterial soaps. If your skin becomes reddened/irritated stop using the CHG.  Do not shave (including legs and underarms) for at least 48 hours prior to first CHG shower. It is OK to shave your face.  Please follow these instructions carefully.     Shower the [employer]  BEFORE SURGERY and the MORNING OF SURGERY with CHG Soap.   If you chose to wash your hair, wash your hair first as usual with your normal shampoo. After you shampoo, rinse your hair and body thoroughly to remove the shampoo.  Then [employer] and genitals (private parts) with your normal soap and rinse thoroughly to remove soap.  After that Use CHG Soap as you would any other liquid soap. You can apply CHG directly to the skin and wash gently with a scrungie or a clean washcloth.   Apply the CHG Soap to your body ONLY FROM THE NECK DOWN.   Do not use on open wounds or open sores. Avoid contact with your eyes, ears, mouth and genitals (private parts). Wash Face and genitals (private parts)  with your normal soap.   Wash thoroughly, paying special attention to the area where your surgery will be performed.  Thoroughly rinse your body with warm water from the neck down.  DO NOT shower/wash with your normal soap after using and rinsing off the CHG Soap.  Pat yourself dry with a CLEAN TOWEL.  Wear CLEAN PAJAMAS to bed the night before surgery  Place CLEAN SHEETS on your bed the night before your surgery  DO NOT SLEEP WITH PETS.   Day of Surgery:  Take a shower with CHG soap. Wear Clean/Comfortable clothing the morning of surgery Do not apply any deodorants/lotions.   Remember to brush your teeth WITH YOUR REGULAR TOOTHPASTE.   Please read over the following fact sheets that you were given.

## 2020-12-02 ENCOUNTER — Encounter (HOSPITAL_COMMUNITY): Payer: Self-pay

## 2020-12-02 ENCOUNTER — Other Ambulatory Visit: Payer: Self-pay

## 2020-12-02 ENCOUNTER — Encounter (HOSPITAL_COMMUNITY)
Admission: RE | Admit: 2020-12-02 | Discharge: 2020-12-02 | Disposition: A | Discharge status: Home / Self Care | Payer: BC Managed Care – PPO | Source: Ambulatory Visit | Attending: Neurosurgery | Admitting: Neurosurgery

## 2020-12-02 DIAGNOSIS — Z01812 Encounter for preprocedural laboratory examination: Secondary | ICD-10-CM | POA: Diagnosis present

## 2020-12-02 HISTORY — DX: Other specified postprocedural states: Z98.890

## 2020-12-02 HISTORY — DX: Nausea with vomiting, unspecified: R11.2

## 2020-12-02 HISTORY — DX: Gastro-esophageal reflux disease without esophagitis: K21.9

## 2020-12-02 HISTORY — DX: Personal history of urinary calculi: Z87.442

## 2020-12-02 LAB — CBC
HCT: 42.7 % (ref 36.0–46.0)
Hemoglobin: 13.6 g/dL (ref 12.0–15.0)
MCH: 31.8 pg (ref 26.0–34.0)
MCHC: 31.9 g/dL (ref 30.0–36.0)
MCV: 99.8 fL (ref 80.0–100.0)
Platelets: 205 10*3/uL (ref 150–400)
RBC: 4.28 MIL/uL (ref 3.87–5.11)
RDW: 13.2 % (ref 11.5–15.5)
WBC: 5.5 10*3/uL (ref 4.0–10.5)
nRBC: 0 % (ref 0.0–0.2)

## 2020-12-02 LAB — BASIC METABOLIC PANEL
Anion gap: 7 (ref 5–15)
BUN: 14 mg/dL (ref 6–20)
CO2: 27 mmol/L (ref 22–32)
Calcium: 9.1 mg/dL (ref 8.9–10.3)
Chloride: 104 mmol/L (ref 98–111)
Creatinine, Ser: 0.94 mg/dL (ref 0.44–1.00)
GFR, Estimated: >60 mL/min (ref >60)
Glucose, Bld: 88 mg/dL (ref 70–99)
Potassium: 4.2 mmol/L (ref 3.5–5.1)
Sodium: 138 mmol/L (ref 135–145)

## 2020-12-02 LAB — SURGICAL PCR SCREEN
MRSA, PCR: NEGATIVE
Staphylococcus aureus: NEGATIVE

## 2020-12-02 NOTE — Progress Notes (Signed)
PCP - Rene Kocher, Denton Lank, MD Cardiologist - denies  PPM/ICD - denies Device Orders - N/A Rep Notified - N/A  Chest x-ray - N/A EKG - 11/06/2020 - requested EKG tracing Stress Test - denies ECHO - denies Cardiac Cath - denies  Sleep Study - denies CPAP - N/A  Fasting Blood Sugar - N/A  Blood Thinner Instructions: N/A  Aspirin Instructions: Patient was instructed: As of today, STOP taking any Aspirin (unless otherwise instructed by your surgeon) Aleve, Naproxen, Ibuprofen, Motrin, Advil, Goody's, BC's, all herbal medications, fish oil, and all vitamins.  ERAS Protcol - No  COVID TEST- patient will go to the COVID testing site on 12/08/2020   Anesthesia review: yes; anesthesia complication  Patient denies shortness of breath, fever, cough and chest pain at PAT appointment   All instructions explained to the patient, with a verbal understanding of the material. Patient agrees to go over the instructions while at home for a better understanding. Patient also instructed to self quarantine after being tested for COVID-19. The opportunity to ask questions was provided.

## 2020-12-08 ENCOUNTER — Other Ambulatory Visit: Payer: Self-pay | Admitting: Neurosurgery

## 2020-12-09 LAB — SARS CORONAVIRUS 2 (TAT 6-24 HRS): SARS Coronavirus 2: NEGATIVE

## 2020-12-09 NOTE — Progress Notes (Signed)
patient voiced understanding of new arrival time of 0815 tomorrow

## 2020-12-09 NOTE — Anesthesia Preprocedure Evaluation (Addendum)
Anesthesia Evaluation  Patient identified by MRN, date of birth, ID band Patient awake    Reviewed: Allergy & Precautions, NPO status , Patient's Chart, lab work & pertinent test results  History of Anesthesia Complications (+) PONV and history of anesthetic complications  Airway Mallampati: III  TM Distance: >3 FB Neck ROM: Limited   Comment: Very limited neck extension  Dental  (+) Teeth Intact, Dental Advisory Given   Pulmonary neg pulmonary ROS,    Pulmonary exam normal breath sounds clear to auscultation       Cardiovascular negative cardio ROS Normal cardiovascular exam Rhythm:Regular Rate:Normal     Neuro/Psych negative psych ROS   GI/Hepatic Neg liver ROS, GERD  Controlled,  Endo/Other  negative endocrine ROS  Renal/GU negative Renal ROS  negative genitourinary   Musculoskeletal  (+) Arthritis , Osteoarthritis,    Abdominal   Peds  Hematology negative hematology ROS (+)   Anesthesia Other Findings   Reproductive/Obstetrics negative OB ROS                            Anesthesia Physical Anesthesia Plan  ASA: 2  Anesthesia Plan: General   Post-op Pain Management:    Induction: Intravenous  PONV Risk Score and Plan: 4 or greater and Ondansetron, Dexamethasone, Midazolam and Treatment may vary due to age or medical condition  Airway Management Planned: Oral ETT and Video Laryngoscope Planned  Additional Equipment:   Intra-op Plan:   Post-operative Plan: Extubation in OR  Informed Consent: I have reviewed the patients History and Physical, chart, labs and discussed the procedure including the risks, benefits and alternatives for the proposed anesthesia with the patient or authorized representative who has indicated his/her understanding and acceptance.     Dental advisory given  Plan Discussed with: CRNA  Anesthesia Plan Comments:        Anesthesia Quick  Evaluation

## 2020-12-10 ENCOUNTER — Inpatient Hospital Stay (HOSPITAL_COMMUNITY): Payer: BC Managed Care – PPO | Admitting: Anesthesiology

## 2020-12-10 ENCOUNTER — Encounter (HOSPITAL_COMMUNITY)
Admission: RE | Disposition: A | Discharge status: Home / Self Care | Payer: Self-pay | Source: Home / Self Care | Attending: Neurosurgery

## 2020-12-10 ENCOUNTER — Inpatient Hospital Stay (HOSPITAL_COMMUNITY): Payer: BC Managed Care – PPO | Admitting: Physician Assistant

## 2020-12-10 ENCOUNTER — Inpatient Hospital Stay (HOSPITAL_COMMUNITY)
Admission: RE | Admit: 2020-12-10 | Discharge: 2020-12-11 | DRG: 473 | Disposition: A | Discharge status: Home / Self Care | Payer: BC Managed Care – PPO | Attending: Neurosurgery | Admitting: Neurosurgery

## 2020-12-10 ENCOUNTER — Other Ambulatory Visit: Payer: Self-pay

## 2020-12-10 ENCOUNTER — Inpatient Hospital Stay (HOSPITAL_COMMUNITY): Payer: BC Managed Care – PPO

## 2020-12-10 ENCOUNTER — Encounter (HOSPITAL_COMMUNITY): Payer: Self-pay | Admitting: Neurosurgery

## 2020-12-10 DIAGNOSIS — Z87442 Personal history of urinary calculi: Secondary | ICD-10-CM

## 2020-12-10 DIAGNOSIS — Z419 Encounter for procedure for purposes other than remedying health state, unspecified: Secondary | ICD-10-CM

## 2020-12-10 DIAGNOSIS — Z79899 Other long term (current) drug therapy: Secondary | ICD-10-CM | POA: Diagnosis not present

## 2020-12-10 DIAGNOSIS — S129XXA Fracture of neck, unspecified, initial encounter: Secondary | ICD-10-CM | POA: Diagnosis present

## 2020-12-10 DIAGNOSIS — K219 Gastro-esophageal reflux disease without esophagitis: Secondary | ICD-10-CM | POA: Diagnosis present

## 2020-12-10 DIAGNOSIS — G8929 Other chronic pain: Secondary | ICD-10-CM | POA: Diagnosis present

## 2020-12-10 DIAGNOSIS — Z888 Allergy status to other drugs, medicaments and biological substances status: Secondary | ICD-10-CM | POA: Diagnosis not present

## 2020-12-10 DIAGNOSIS — M1990 Unspecified osteoarthritis, unspecified site: Secondary | ICD-10-CM | POA: Diagnosis present

## 2020-12-10 DIAGNOSIS — M542 Cervicalgia: Secondary | ICD-10-CM | POA: Diagnosis present

## 2020-12-10 DIAGNOSIS — Z90710 Acquired absence of both cervix and uterus: Secondary | ICD-10-CM

## 2020-12-10 DIAGNOSIS — M960 Pseudarthrosis after fusion or arthrodesis: Secondary | ICD-10-CM | POA: Diagnosis present

## 2020-12-10 HISTORY — PX: POSTERIOR CERVICAL FUSION/FORAMINOTOMY: SHX5038

## 2020-12-10 LAB — TYPE AND SCREEN
ABO/RH(D): O NEG
Antibody Screen: POSITIVE

## 2020-12-10 SURGERY — POSTERIOR CERVICAL FUSION/FORAMINOTOMY LEVEL 1
Anesthesia: General | Site: Spine Cervical

## 2020-12-10 MED ORDER — SCOPOLAMINE 1 MG/3DAYS TD PT72
MEDICATED_PATCH | TRANSDERMAL | Status: AC
  Filled 2020-12-10: qty 1

## 2020-12-10 MED ORDER — ONDANSETRON HCL 4 MG/2ML IJ SOLN
INTRAMUSCULAR | Status: AC
  Filled 2020-12-10: qty 2

## 2020-12-10 MED ORDER — FENTANYL CITRATE (PF) 250 MCG/5ML IJ SOLN
INTRAMUSCULAR | Status: AC
  Filled 2020-12-10: qty 5

## 2020-12-10 MED ORDER — ALUM & MAG HYDROXIDE-SIMETH 200-200-20 MG/5ML PO SUSP: 30.0000 mL | Freq: Four times a day (QID) | ORAL | Status: DC | PRN

## 2020-12-10 MED ORDER — CEFAZOLIN SODIUM-DEXTROSE 2-4 GM/100ML-% IV SOLN
2.0000 g | INTRAVENOUS | Status: AC
  Administered 2020-12-10: 2 g via INTRAVENOUS

## 2020-12-10 MED ORDER — HYDROMORPHONE HCL 1 MG/ML IJ SOLN
INTRAMUSCULAR | Status: AC
  Filled 2020-12-10: qty 1

## 2020-12-10 MED ORDER — ONDANSETRON HCL 4 MG/2ML IJ SOLN: 4.0000 mg | Freq: Four times a day (QID) | INTRAMUSCULAR | Status: DC | PRN

## 2020-12-10 MED ORDER — AMITRIPTYLINE HCL 50 MG PO TABS
50.0000 mg | ORAL_TABLET | Freq: Every day | ORAL | Status: DC
  Administered 2020-12-10: 50 mg via ORAL
  Filled 2020-12-10 (×2): qty 1

## 2020-12-10 MED ORDER — PANTOPRAZOLE SODIUM 40 MG PO TBEC
40.0000 mg | DELAYED_RELEASE_TABLET | Freq: Every day | ORAL | Status: DC
  Administered 2020-12-10: 40 mg via ORAL
  Filled 2020-12-10: qty 1

## 2020-12-10 MED ORDER — PROPOFOL 10 MG/ML IV BOLUS
INTRAVENOUS | Status: AC
  Filled 2020-12-10: qty 20

## 2020-12-10 MED ORDER — DEXAMETHASONE SODIUM PHOSPHATE 4 MG/ML IJ SOLN
4.0000 mg | Freq: Four times a day (QID) | INTRAMUSCULAR | Status: AC
  Administered 2020-12-10: 4 mg via INTRAVENOUS
  Filled 2020-12-10: qty 1

## 2020-12-10 MED ORDER — MEPERIDINE HCL 25 MG/ML IJ SOLN: 6.2500 mg | INTRAMUSCULAR | Status: DC | PRN

## 2020-12-10 MED ORDER — THROMBIN 5000 UNITS EX SOLR: OROMUCOSAL | Status: DC | PRN

## 2020-12-10 MED ORDER — SUGAMMADEX SODIUM 200 MG/2ML IV SOLN
INTRAVENOUS | Status: DC | PRN
  Administered 2020-12-10: 200 mg via INTRAVENOUS

## 2020-12-10 MED ORDER — CHLORHEXIDINE GLUCONATE 0.12 % MT SOLN: 15.0000 mL | Freq: Once | OROMUCOSAL | Status: AC

## 2020-12-10 MED ORDER — CHLORHEXIDINE GLUCONATE 0.12 % MT SOLN
OROMUCOSAL | Status: AC
  Administered 2020-12-10: 15 mL via OROMUCOSAL
  Filled 2020-12-10: qty 15

## 2020-12-10 MED ORDER — PREGABALIN 100 MG PO CAPS
100.0000 mg | ORAL_CAPSULE | Freq: Two times a day (BID) | ORAL | Status: DC
  Administered 2020-12-10 – 2020-12-11 (×2): 100 mg via ORAL
  Filled 2020-12-10 (×2): qty 1

## 2020-12-10 MED ORDER — VITAMIN D 25 MCG (1000 UNIT) PO TABS
1000.0000 [IU] | ORAL_TABLET | Freq: Every day | ORAL | Status: DC
  Administered 2020-12-10 – 2020-12-11 (×2): 1000 [IU] via ORAL
  Filled 2020-12-10 (×4): qty 1

## 2020-12-10 MED ORDER — VENLAFAXINE HCL ER 75 MG PO CP24
150.0000 mg | ORAL_CAPSULE | Freq: Every day | ORAL | Status: DC
  Filled 2020-12-10: qty 2

## 2020-12-10 MED ORDER — ACETAMINOPHEN 500 MG PO TABS
1000.0000 mg | ORAL_TABLET | Freq: Four times a day (QID) | ORAL | Status: DC
  Administered 2020-12-10: 1000 mg via ORAL
  Filled 2020-12-10: qty 2

## 2020-12-10 MED ORDER — BUPIVACAINE-EPINEPHRINE 0.5% -1:200000 IJ SOLN
INTRAMUSCULAR | Status: DC | PRN
  Administered 2020-12-10 (×2): 10 mL

## 2020-12-10 MED ORDER — BUPIVACAINE-EPINEPHRINE 0.5% -1:200000 IJ SOLN
INTRAMUSCULAR | Status: AC
  Filled 2020-12-10: qty 1

## 2020-12-10 MED ORDER — FENTANYL CITRATE (PF) 250 MCG/5ML IJ SOLN
INTRAMUSCULAR | Status: DC | PRN
  Administered 2020-12-10 (×3): 50 ug via INTRAVENOUS
  Administered 2020-12-10: 25 ug via INTRAVENOUS
  Administered 2020-12-10: 50 ug via INTRAVENOUS

## 2020-12-10 MED ORDER — PANTOPRAZOLE SODIUM 40 MG IV SOLR: 40.0000 mg | Freq: Every day | INTRAVENOUS | Status: DC

## 2020-12-10 MED ORDER — DEXAMETHASONE SODIUM PHOSPHATE 10 MG/ML IJ SOLN
INTRAMUSCULAR | Status: AC
  Filled 2020-12-10: qty 1

## 2020-12-10 MED ORDER — THROMBIN 5000 UNITS EX SOLR
CUTANEOUS | Status: AC
  Filled 2020-12-10: qty 15000

## 2020-12-10 MED ORDER — DEXAMETHASONE SODIUM PHOSPHATE 10 MG/ML IJ SOLN
INTRAMUSCULAR | Status: DC | PRN
  Administered 2020-12-10: 10 mg via INTRAVENOUS

## 2020-12-10 MED ORDER — TIZANIDINE HCL 4 MG PO TABS
4.0000 mg | ORAL_TABLET | Freq: Four times a day (QID) | ORAL | Status: DC | PRN
  Administered 2020-12-10 – 2020-12-11 (×2): 4 mg via ORAL
  Filled 2020-12-10 (×2): qty 1

## 2020-12-10 MED ORDER — LACTATED RINGERS IV SOLN: INTRAVENOUS | Status: DC

## 2020-12-10 MED ORDER — CYCLOBENZAPRINE HCL 10 MG PO TABS: 10.0000 mg | ORAL_TABLET | Freq: Three times a day (TID) | ORAL | Status: DC | PRN

## 2020-12-10 MED ORDER — GABAPENTIN 400 MG PO CAPS
1200.0000 mg | ORAL_CAPSULE | Freq: Every morning | ORAL | Status: DC
  Administered 2020-12-11: 1200 mg via ORAL
  Filled 2020-12-10: qty 3

## 2020-12-10 MED ORDER — DEXAMETHASONE 4 MG PO TABS
4.0000 mg | ORAL_TABLET | Freq: Four times a day (QID) | ORAL | Status: AC
  Administered 2020-12-10: 4 mg via ORAL
  Filled 2020-12-10: qty 1

## 2020-12-10 MED ORDER — PROMETHAZINE HCL 25 MG/ML IJ SOLN: 6.2500 mg | INTRAMUSCULAR | Status: DC | PRN

## 2020-12-10 MED ORDER — HEMOSTATIC AGENTS (NO CHARGE) OPTIME
TOPICAL | Status: DC | PRN
  Administered 2020-12-10: 1 via TOPICAL

## 2020-12-10 MED ORDER — CHLORHEXIDINE GLUCONATE CLOTH 2 % EX PADS: 6.0000 | MEDICATED_PAD | Freq: Once | CUTANEOUS | Status: DC

## 2020-12-10 MED ORDER — DOCUSATE SODIUM 100 MG PO CAPS
100.0000 mg | ORAL_CAPSULE | Freq: Two times a day (BID) | ORAL | Status: DC
  Administered 2020-12-10 – 2020-12-11 (×3): 100 mg via ORAL
  Filled 2020-12-10 (×3): qty 1

## 2020-12-10 MED ORDER — MIDAZOLAM HCL 2 MG/2ML IJ SOLN
INTRAMUSCULAR | Status: DC | PRN
  Administered 2020-12-10 (×2): 1 mg via INTRAVENOUS

## 2020-12-10 MED ORDER — ONDANSETRON HCL 4 MG PO TABS: 4.0000 mg | ORAL_TABLET | Freq: Four times a day (QID) | ORAL | Status: DC | PRN

## 2020-12-10 MED ORDER — ACETAMINOPHEN 650 MG RE SUPP: 650.0000 mg | RECTAL | Status: DC | PRN

## 2020-12-10 MED ORDER — MENTHOL 3 MG MT LOZG: 1.0000 | LOZENGE | OROMUCOSAL | Status: DC | PRN

## 2020-12-10 MED ORDER — OXYCODONE HCL 5 MG PO TABS
5.0000 mg | ORAL_TABLET | Freq: Once | ORAL | Status: AC | PRN
  Administered 2020-12-10: 5 mg via ORAL

## 2020-12-10 MED ORDER — ONDANSETRON HCL 4 MG/2ML IJ SOLN
INTRAMUSCULAR | Status: DC | PRN
  Administered 2020-12-10: 4 mg via INTRAVENOUS

## 2020-12-10 MED ORDER — MORPHINE SULFATE (PF) 4 MG/ML IV SOLN
4.0000 mg | INTRAVENOUS | Status: DC | PRN
  Administered 2020-12-10: 4 mg via INTRAVENOUS
  Filled 2020-12-10: qty 1

## 2020-12-10 MED ORDER — PHENYLEPHRINE HCL-NACL 20-0.9 MG/250ML-% IV SOLN
INTRAVENOUS | Status: DC | PRN
  Administered 2020-12-10: 25 ug/min via INTRAVENOUS

## 2020-12-10 MED ORDER — OXYCODONE HCL 5 MG/5ML PO SOLN: 5.0000 mg | Freq: Once | ORAL | Status: AC | PRN

## 2020-12-10 MED ORDER — SCOPOLAMINE 1 MG/3DAYS TD PT72
MEDICATED_PATCH | TRANSDERMAL | Status: DC | PRN
  Administered 2020-12-10: 1 via TRANSDERMAL

## 2020-12-10 MED ORDER — MIDAZOLAM HCL 2 MG/2ML IJ SOLN
INTRAMUSCULAR | Status: AC
  Filled 2020-12-10: qty 2

## 2020-12-10 MED ORDER — PILOCARPINE HCL 5 MG PO TABS
5.0000 mg | ORAL_TABLET | Freq: Three times a day (TID) | ORAL | Status: DC
  Administered 2020-12-10 – 2020-12-11 (×2): 5 mg via ORAL
  Filled 2020-12-10 (×5): qty 1

## 2020-12-10 MED ORDER — PHENYLEPHRINE HCL (PRESSORS) 10 MG/ML IV SOLN
INTRAVENOUS | Status: DC | PRN
  Administered 2020-12-10 (×3): 80 ug via INTRAVENOUS

## 2020-12-10 MED ORDER — PROPOFOL 10 MG/ML IV BOLUS
INTRAVENOUS | Status: DC | PRN
  Administered 2020-12-10: 20 mg via INTRAVENOUS
  Administered 2020-12-10: 150 mg via INTRAVENOUS

## 2020-12-10 MED ORDER — ROCURONIUM BROMIDE 10 MG/ML (PF) SYRINGE
PREFILLED_SYRINGE | INTRAVENOUS | Status: AC
  Filled 2020-12-10: qty 20

## 2020-12-10 MED ORDER — OXYCODONE HCL 5 MG PO TABS
10.0000 mg | ORAL_TABLET | ORAL | Status: DC | PRN
  Administered 2020-12-10: 10 mg via ORAL
  Filled 2020-12-10 (×2): qty 2

## 2020-12-10 MED ORDER — GABAPENTIN 300 MG PO CAPS: 900.0000 mg | ORAL_CAPSULE | Freq: Every day | ORAL | Status: DC

## 2020-12-10 MED ORDER — ORAL CARE MOUTH RINSE: 15.0000 mL | Freq: Once | OROMUCOSAL | Status: AC

## 2020-12-10 MED ORDER — ACETAMINOPHEN 500 MG PO TABS: 1000.0000 mg | ORAL_TABLET | Freq: Once | ORAL | Status: AC

## 2020-12-10 MED ORDER — BISACODYL 10 MG RE SUPP: 10.0000 mg | Freq: Every day | RECTAL | Status: DC | PRN

## 2020-12-10 MED ORDER — ACETAMINOPHEN 500 MG PO TABS: 1000.0000 mg | ORAL_TABLET | Freq: Four times a day (QID) | ORAL | Status: DC | PRN

## 2020-12-10 MED ORDER — ACETAMINOPHEN 325 MG PO TABS: 650.0000 mg | ORAL_TABLET | ORAL | Status: DC | PRN

## 2020-12-10 MED ORDER — CEFAZOLIN SODIUM-DEXTROSE 2-4 GM/100ML-% IV SOLN
2.0000 g | Freq: Three times a day (TID) | INTRAVENOUS | Status: AC
  Administered 2020-12-10 – 2020-12-11 (×2): 2 g via INTRAVENOUS
  Filled 2020-12-10 (×2): qty 100

## 2020-12-10 MED ORDER — PENTOSAN POLYSULFATE SODIUM 100 MG PO CAPS
100.0000 mg | ORAL_CAPSULE | Freq: Every day | ORAL | Status: DC
  Administered 2020-12-10: 100 mg via ORAL
  Filled 2020-12-10 (×2): qty 1

## 2020-12-10 MED ORDER — 0.9 % SODIUM CHLORIDE (POUR BTL) OPTIME
TOPICAL | Status: DC | PRN
  Administered 2020-12-10: 1000 mL

## 2020-12-10 MED ORDER — DIPHENHYDRAMINE HCL 25 MG PO CAPS
25.0000 mg | ORAL_CAPSULE | Freq: Four times a day (QID) | ORAL | Status: DC | PRN
  Administered 2020-12-10: 25 mg via ORAL
  Filled 2020-12-10: qty 1

## 2020-12-10 MED ORDER — LACTATED RINGERS IV SOLN: INTRAVENOUS | Status: DC | PRN

## 2020-12-10 MED ORDER — HYDROCODONE-ACETAMINOPHEN 5-325 MG PO TABS
1.0000 | ORAL_TABLET | ORAL | Status: DC | PRN
  Administered 2020-12-10 – 2020-12-11 (×4): 2 via ORAL
  Filled 2020-12-10 (×4): qty 2

## 2020-12-10 MED ORDER — LIDOCAINE 2% (20 MG/ML) 5 ML SYRINGE
INTRAMUSCULAR | Status: AC
  Filled 2020-12-10: qty 5

## 2020-12-10 MED ORDER — GABAPENTIN 300 MG PO CAPS: 900.0000 mg | ORAL_CAPSULE | ORAL | Status: DC

## 2020-12-10 MED ORDER — PHENOL 1.4 % MT LIQD: 1.0000 | OROMUCOSAL | Status: DC | PRN

## 2020-12-10 MED ORDER — CEFAZOLIN SODIUM-DEXTROSE 2-4 GM/100ML-% IV SOLN
INTRAVENOUS | Status: AC
  Filled 2020-12-10: qty 100

## 2020-12-10 MED ORDER — ROCURONIUM BROMIDE 10 MG/ML (PF) SYRINGE
PREFILLED_SYRINGE | INTRAVENOUS | Status: DC | PRN
  Administered 2020-12-10: 20 mg via INTRAVENOUS
  Administered 2020-12-10: 100 mg via INTRAVENOUS

## 2020-12-10 MED ORDER — HYDROMORPHONE HCL 1 MG/ML IJ SOLN
0.2500 mg | INTRAMUSCULAR | Status: DC | PRN
  Administered 2020-12-10 (×4): 0.5 mg via INTRAVENOUS

## 2020-12-10 MED ORDER — OXYCODONE HCL 5 MG PO TABS
ORAL_TABLET | ORAL | Status: AC
  Filled 2020-12-10: qty 1

## 2020-12-10 MED ORDER — ACETAMINOPHEN 500 MG PO TABS
ORAL_TABLET | ORAL | Status: AC
  Administered 2020-12-10: 1000 mg via ORAL
  Filled 2020-12-10: qty 2

## 2020-12-10 MED ORDER — ROCURONIUM BROMIDE 10 MG/ML (PF) SYRINGE
PREFILLED_SYRINGE | INTRAVENOUS | Status: AC
  Filled 2020-12-10: qty 10

## 2020-12-10 MED ORDER — OXYCODONE HCL 5 MG PO TABS: 5.0000 mg | ORAL_TABLET | ORAL | Status: DC | PRN

## 2020-12-10 MED ORDER — ESMOLOL HCL 100 MG/10ML IV SOLN
INTRAVENOUS | Status: AC
  Filled 2020-12-10: qty 10

## 2020-12-10 MED ORDER — CLONAZEPAM 0.5 MG PO TABS
0.5000 mg | ORAL_TABLET | Freq: Two times a day (BID) | ORAL | Status: DC
  Administered 2020-12-10 – 2020-12-11 (×2): 0.5 mg via ORAL
  Filled 2020-12-10 (×2): qty 1

## 2020-12-10 MED ORDER — BACITRACIN 500 UNIT/GM EX OINT
TOPICAL_OINTMENT | CUTANEOUS | Status: DC | PRN
  Administered 2020-12-10: 1 via TOPICAL

## 2020-12-10 MED ORDER — BACITRACIN ZINC 500 UNIT/GM EX OINT
TOPICAL_OINTMENT | CUTANEOUS | Status: AC
  Filled 2020-12-10: qty 28.35

## 2020-12-10 MED ORDER — ZOLPIDEM TARTRATE 5 MG PO TABS: 5.0000 mg | ORAL_TABLET | Freq: Every evening | ORAL | Status: DC | PRN

## 2020-12-10 MED ORDER — GABAPENTIN 300 MG PO CAPS
900.0000 mg | ORAL_CAPSULE | Freq: Every day | ORAL | Status: DC
  Administered 2020-12-10: 900 mg via ORAL
  Filled 2020-12-10: qty 3

## 2020-12-10 MED ORDER — THROMBIN 5000 UNITS EX SOLR
CUTANEOUS | Status: DC | PRN
  Administered 2020-12-10 (×2): 5000 [IU] via TOPICAL

## 2020-12-10 MED ORDER — LIDOCAINE 2% (20 MG/ML) 5 ML SYRINGE
INTRAMUSCULAR | Status: DC | PRN
  Administered 2020-12-10: 60 mg via INTRAVENOUS

## 2020-12-10 SURGICAL SUPPLY — 52 items
APL SKNCLS STERI-STRIP NONHPOA (GAUZE/BANDAGES/DRESSINGS) ×1
BAG COUNTER SPONGE SURGICOUNT (BAG) ×2 IMPLANT
BAG SPNG CNTER NS LX DISP (BAG) ×1
BENZOIN TINCTURE PRP APPL 2/3 (GAUZE/BANDAGES/DRESSINGS) ×1 IMPLANT
BIT DRILL NEURO 2X3.1 SFT TUCH (MISCELLANEOUS) ×1 IMPLANT
BLADE CLIPPER SURG (BLADE) ×1 IMPLANT
CANISTER SUCT 3000ML PPV (MISCELLANEOUS) ×2 IMPLANT
CAP CLSR POST CERV (Cap) ×4 IMPLANT
CARTRIDGE OIL MAESTRO DRILL (MISCELLANEOUS) ×1 IMPLANT
DIFFUSER DRILL AIR PNEUMATIC (MISCELLANEOUS) ×2 IMPLANT
DRAPE C-ARM 42X72 X-RAY (DRAPES) ×4 IMPLANT
DRAPE LAPAROTOMY 100X72 PEDS (DRAPES) ×2 IMPLANT
DRAPE SURG 17X23 STRL (DRAPES) ×7 IMPLANT
DRILL NEURO 2X3.1 SOFT TOUCH (MISCELLANEOUS) ×2
DRSG OPSITE POSTOP 4X6 (GAUZE/BANDAGES/DRESSINGS) ×1 IMPLANT
ELECT REM PT RETURN 9FT ADLT (ELECTROSURGICAL) ×2
ELECTRODE REM PT RTRN 9FT ADLT (ELECTROSURGICAL) ×1 IMPLANT
GAUZE 4X4 16PLY ~~LOC~~+RFID DBL (SPONGE) ×1 IMPLANT
GLOVE SURG ENC MOIS LTX SZ8 (GLOVE) ×3 IMPLANT
GLOVE SURG ENC MOIS LTX SZ8.5 (GLOVE) ×3 IMPLANT
GOWN STRL REUS W/ TWL LRG LVL3 (GOWN DISPOSABLE) IMPLANT
GOWN STRL REUS W/ TWL XL LVL3 (GOWN DISPOSABLE) ×1 IMPLANT
GOWN STRL REUS W/TWL 2XL LVL3 (GOWN DISPOSABLE) ×1 IMPLANT
GOWN STRL REUS W/TWL LRG LVL3 (GOWN DISPOSABLE) ×4
GOWN STRL REUS W/TWL XL LVL3 (GOWN DISPOSABLE) ×4
HEMOSTAT POWDER KIT SURGIFOAM (HEMOSTASIS) ×1 IMPLANT
KIT BASIN OR (CUSTOM PROCEDURE TRAY) ×2 IMPLANT
KIT TURNOVER KIT B (KITS) ×2 IMPLANT
NDL SPNL 18GX3.5 QUINCKE PK (NEEDLE) IMPLANT
NEEDLE HYPO 22GX1.5 SAFETY (NEEDLE) ×2 IMPLANT
NEEDLE SPNL 18GX3.5 QUINCKE PK (NEEDLE) ×2 IMPLANT
NS IRRIG 1000ML POUR BTL (IV SOLUTION) ×2 IMPLANT
OIL CARTRIDGE MAESTRO DRILL (MISCELLANEOUS) ×2
PACK LAMINECTOMY NEURO (CUSTOM PROCEDURE TRAY) ×2 IMPLANT
PAD ARMBOARD 7.5X6 YLW CONV (MISCELLANEOUS) ×6 IMPLANT
PATTIES SURGICAL .25X.25 (GAUZE/BANDAGES/DRESSINGS) IMPLANT
PIN MAYFIELD SKULL DISP (PIN) ×2 IMPLANT
PUTTY DBM 5CC CALC GRAN (Putty) ×1 IMPLANT
ROD VIRAGE 30MMX3.5MM STR (Rod) ×2 IMPLANT
SCREW VIRAGE 3.5X14 (Screw) ×4 IMPLANT
SPONGE NEURO XRAY DETECT 1X3 (DISPOSABLE) IMPLANT
SPONGE SURGIFOAM ABS GEL SZ50 (HEMOSTASIS) ×2 IMPLANT
SPONGE T-LAP 4X18 ~~LOC~~+RFID (SPONGE) ×2 IMPLANT
STAPLER SKIN PROX WIDE 3.9 (STAPLE) IMPLANT
STRIP CLOSURE SKIN 1/2X4 (GAUZE/BANDAGES/DRESSINGS) ×1 IMPLANT
SUT ETHILON 2 0 FS 18 (SUTURE) IMPLANT
SUT VIC AB 0 CT1 18XCR BRD8 (SUTURE) ×1 IMPLANT
SUT VIC AB 0 CT1 8-18 (SUTURE) ×2
SUT VIC AB 2-0 CP2 18 (SUTURE) ×3 IMPLANT
TOWEL GREEN STERILE (TOWEL DISPOSABLE) ×2 IMPLANT
TOWEL GREEN STERILE FF (TOWEL DISPOSABLE) ×2 IMPLANT
WATER STERILE IRR 1000ML POUR (IV SOLUTION) ×2 IMPLANT

## 2020-12-10 NOTE — Progress Notes (Signed)
Orthopedic Tech Progress Note Patient Details:  [name] [facility] May 29, 1960 [id number]  Ortho Devices Type of Ortho Device: Aspen cervical collar Ortho Device/Splint Location: Neck Ortho Device/Splint Interventions: Ordered      [name] 12/10/2020, 1:50 PM

## 2020-12-10 NOTE — Transfer of Care (Signed)
Immediate Anesthesia Transfer of Care Note  Patient: [name] [facility]  Procedure(s) Performed: POSTERIOR CERVICAL FUSION WITH LATERAL MASS FIXATION CERVICAL FIVE-SIX (Spine Cervical)  Patient Location: PACU  Anesthesia Type:General  Level of Consciousness: sedated  Airway & Oxygen Therapy: Patient Spontanous Breathing and Patient connected to nasal cannula oxygen  Post-op Assessment: Report given to RN and Post -op Vital signs reviewed and stable  Post vital signs: Reviewed and stable  Last Vitals:  Vitals Value Taken Time  BP 117/55 12/10/20 1302  Temp    Pulse 78 12/10/20 1302  Resp 11 12/10/20 1302  SpO2 100 % 12/10/20 1302  Vitals shown include unvalidated device data.  Last Pain:  Vitals:   12/10/20 0855  TempSrc:   PainSc: 0-No pain         Complications: No notable events documented.

## 2020-12-10 NOTE — Op Note (Signed)
Brief history: The patient is a 60 year old white female on whom I previously performed a C3-4, C4-5 and C5-6 anterior cervicectomy, fusion and plating.  She initially did well but has developed chronic neck pain.  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  I discussed the various treatment options with her.  She has decided proceed with surgery.  Preop diagnosis: Cervical pseudoarthrosis, cervicalgia  Postop diagnosis: The same  Procedure: Posterior C5-6 arthrodesis with intergrow bone graft extender, posterior cervical instrumentation with Zimmer titanium lateral mass screws and rods.  Surgeon: Dr. Delma Officer  Assistant: Dr. Coletta Memos and Hildred Priest, NP  Anesthesia: General tracheal  Estimated blood loss: Minimal  Specimens: None  Drains: None  Complications: None  Description of procedure: The patient was brought to the operating room by the anesthesia team.  General endotracheal anesthesia was induced.  I applied the [place] three-point headrest to the patient's calvarium.  She was carefully turned to the prone position on the chest rolls.  Her neck was placed in a neutral position.  The patient's suboccipital region was then shaved with clippers and this region as well as the posterior cervical and upper thoracic region was then prepared with DuraPrep.  Sterile drapes were applied.  I then injected the area to be incised with Marcaine with epinephrine solution.  I used the scalpel to make a linear midline incision over the C5-6 interspace.  We used electrocautery to perform a bilateral subperiosteal dissection exposing the spinous process and lamina of C5 and C6.  We inserted the [facility] retractor for exposure.  We used intraoperative fluoroscopy to confirm our location.  There was some difficulty with the visualization of fluoroscopy because of the patient's  shoulders.  I used electrocautery to expose the bilateral lateral masses at C5 and C6.  I drilled a hole in the center of the lateral masses bilaterally at C5 and C6.  We used the drill guide and drilled a 14 mm lateral mass pilot hole in the to the bilateral C5 and C6 lateral masses.  We probed inside to drill holes throughout cortical breaches.  There were none.  I then tapped the holes and inserted a 14 mm polyaxial screw into the bilateral C5 and C6 lateral masses.  We got good bony purchase.  We then connected the unilateral screws with a rod.  We secured the rod in place with the caps which we tightened appropriately.  This completed the instrumentation.  We now turned our attention to the posterior lateral arthrodesis.  I used a high-speed drill to decorticate the lateral lamina and bilateral C5 and C6 lateral masses.  We laid bone graft extender over these decorticated structures completing the posterior lateral arthrodesis at C5-6.  We then obtained hemostasis with bipolar cautery.  We remove the retractor.  We then reapproximated the patient's cervical thoracic fascia with interrupted 0 Vicryl suture.  We reapproximated the cutaneous tissue with interrupted 2-0 Vicryl suture.  We reapproximated the skin with Steri-Strips and benzoin.   A sterile dressing was applied.  The drapes were removed.  The patient was then carefully returned to the supine position.  I then remove the [place] three-point headrest from her calvarium.  By report all sponge needle and instrument counts were correct at the end this case.

## 2020-12-10 NOTE — Anesthesia Procedure Notes (Signed)
Procedure Name: Intubation Date/Time: 12/10/2020 11:01 AM Performed by: Asher Muir, CRNA Pre-anesthesia Checklist: Patient identified, Emergency Drugs available, Suction available and Patient being monitored Patient Re-evaluated:Patient Re-evaluated prior to induction Oxygen Delivery Method: Circle system utilized and Simple face mask Preoxygenation: Pre-oxygenation with 100% oxygen Induction Type: IV induction Ventilation: Mask ventilation without difficulty Laryngoscope Size: Mac, 3 and Glidescope Grade View: Grade I Tube type: Oral Tube size: 7.0 mm Number of attempts: 1 Airway Equipment and Method: Stylet Placement Confirmation: ETT inserted through vocal cords under direct vision, positive ETCO2 and breath sounds checked- equal and bilateral Secured at: 22 (right lip) cm Tube secured with: Tape Dental Injury: Teeth and Oropharynx as per pre-operative assessment

## 2020-12-10 NOTE — H&P (Signed)
Subjective: The patient is a 60 year old white female on whom I previously performed a C3-4, C4-5 and C5-6 anterior cervicectomy, fusion and plating.  She initially seemed to be improving but developed chronic neck pain.  X-rays and CT scan were consistent with a pseudoarthrosis.  I discussed the various treatment options.  She has decided to proceed with surgery. Past Medical History:  Diagnosis Date   Arthritis    Chronic kidney disease    GERD (gastroesophageal reflux disease)    H/O neck surgery    History of kidney stones    Interstitial cystitis    PONV (postoperative nausea and vomiting)     Past Surgical History:  Procedure Laterality Date   ABDOMINAL HYSTERECTOMY     BACK SURGERY     02/2018   neck durgery      Allergies  Allergen Reactions   Betadine Antibiotic-Moisturize [Bacitracin-Polymyxin B] Itching, Swelling and Rash    Social History   Tobacco Use   Smoking status: Never   Smokeless tobacco: Never  Substance Use Topics   Alcohol use: Never    History reviewed. No pertinent family history. Prior to Admission medications   Medication Sig Start Date End Date Taking? Authorizing Provider  acetaminophen (TYLENOL) 500 MG tablet Take 2 tablets (1,000 mg total) by mouth every 6 (six) hours as needed for mild pain. 08/31/18  Yes Trixie Deis R, PA-C  acidophilus (RISAQUAD) CAPS capsule Take 1 capsule by mouth daily.   Yes [provider]  ALPHA-LIPOIC ACID PO Take 1 tablet by mouth daily.   Yes [provider]  amitriptyline (ELAVIL) 50 MG tablet Take 50 mg by mouth at bedtime. 07/27/18  Yes [provider]  CALCIUM-VITAMIN D PO Take 1 tablet by mouth daily.   Yes [provider]  Cholecalciferol (VITAMIN D3 PO) Take 3 tablets by mouth daily.   Yes [provider]  clonazePAM (KLONOPIN) 0.5 MG tablet Take 0.5 mg by mouth 2 (two) times daily.   Yes [provider]  ELMIRON 100 MG capsule Take 100 mg by mouth daily.  07/27/18  Yes [provider]  gabapentin (NEURONTIN) 300 MG capsule Take 900-1,200 mg by mouth See admin instructions. Take 1200 mg by mouth in the morning and 900 mg in the afternoon and 900 mg at bedtime 08/09/18  Yes [provider]  hydroxypropyl methylcellulose / hypromellose (ISOPTO TEARS / GONIOVISC) 2.5 % ophthalmic solution Place 1 drop into both eyes 3 (three) times daily as needed for dry eyes.   Yes [provider]  MAGNESIUM PO Take 1 tablet by mouth daily.   Yes [provider]  pilocarpine (SALAGEN) 5 MG tablet Take 5 mg by mouth 3 (three) times daily. 10/06/20  Yes [provider]  polycarbophil (FIBERCON) 625 MG tablet Take 625 mg by mouth daily.   Yes [provider]  pregabalin (LYRICA) 100 MG capsule Take 100 mg by mouth 2 (two) times daily as needed for pain. 11/11/20  Yes [provider]  tiZANidine (ZANAFLEX) 4 MG tablet Take 4 mg by mouth every 6 (six) hours as needed for muscle spasms. 11/20/20  Yes [provider]  venlafaxine XR (EFFEXOR-XR) 150 MG 24 hr capsule Take 150 mg by mouth daily with breakfast.   Yes [provider]  vitamin C (ASCORBIC ACID) 500 MG tablet Take 500 mg by mouth daily.   Yes [provider]  YUVAFEM 10 MCG TABS vaginal tablet Place 10 mcg vaginally 2 (two) times a week.  09/22/20  Yes [provider]  ZINC GLUCONATE PO Take 2 tablets by mouth daily.   Yes [provider]  diazepam (VALIUM) 5 MG tablet Take 1 tablet (5 mg total) by mouth every 8 (eight) hours as needed for muscle spasms. Patient not taking: No sig reported 03/01/18   Tressie Stalker, MD  docusate sodium (COLACE) 100 MG capsule Take 1 capsule (100 mg total) by mouth 2 (two) times daily. Patient not taking: No sig reported 03/01/18   Tressie Stalker, MD  ibuprofen (ADVIL) 600 MG tablet Take 1 tablet (600 mg total) by mouth 4 (four) times daily. Patient not taking: No sig reported  08/31/18   Juliet Rude, PA-C  methocarbamol (ROBAXIN) 750 MG tablet Take 1 tablet (750 mg total) by mouth 3 (three) times daily. Patient not taking: No sig reported 08/31/18   Juliet Rude, PA-C  oxyCODONE (OXY IR/ROXICODONE) 5 MG immediate release tablet Take 1 tablet (5 mg total) by mouth every 4 (four) hours as needed for moderate pain. Patient not taking: No sig reported 03/01/18   Tressie Stalker, MD  traMADol (ULTRAM) 50 MG tablet Take 1 tablet (50 mg total) by mouth every 6 (six) hours as needed for moderate pain or severe pain (50 mg for moderate, 100 mg for severe). Patient not taking: No sig reported 08/31/18   Juliet Rude, PA-C     Review of Systems  Positive ROS: As above  All other systems have been reviewed and were otherwise negative with the exception of those mentioned in the HPI and as above.  Objective: Vital signs in last 24 hours: Temp:  [97.6 F (36.4 C)] 97.6 F (36.4 C) (08/17 0842) Pulse Rate:  [83] 83 (08/17 0842) Resp:  [20] 20 (08/17 0842) BP: (131)/(85) 131/85 (08/17 0842) SpO2:  [95 %] 95 % (08/17 0842) Weight:  [90.7 kg] 90.7 kg (08/17 0842) Estimated body mass index is 30.86 kg/m as calculated from the following:   Height as of this encounter: 5' 7.5" (1.715 m).   Weight as of this encounter: 90.7 kg.   General Appearance: Alert Head: Normocephalic, without obvious abnormality, atraumatic Eyes: PERRL, conjunctiva/corneas clear, EOM's intact,    Ears: Normal  Throat: Normal  Neck: Limited cervical range of motion.  Her anterior cervical incision is well-healed. Back: unremarkable Lungs: Clear to auscultation bilaterally, respirations unlabored Heart: Regular rate and rhythm, no murmur, rub or gallop Abdomen: Soft, non-tender Extremities: Extremities normal, atraumatic, no cyanosis or edema Skin: unremarkable  NEUROLOGIC:   Mental status: alert and oriented,Motor Exam - grossly normal Sensory Exam - grossly normal Reflexes:   Coordination - grossly normal Gait - grossly normal Balance - grossly normal Cranial Nerves: I: smell Not tested  II: visual acuity  OS: Normal  OD: Normal   II: visual fields Full to confrontation  II: pupils Equal, round, reactive to light  III,VII: ptosis None  III,IV,VI: extraocular muscles  Full ROM  V: mastication Normal  V: facial light touch sensation  Normal  V,VII: corneal reflex  Present  VII: facial muscle function - upper  Normal  VII: facial muscle function - lower Normal  VIII: hearing Not tested  IX: soft palate elevation  Normal  IX,X: gag reflex Present  XI: trapezius strength  5/5  XI: sternocleidomastoid strength 5/5  XI: neck flexion strength  5/5  XII: tongue strength  Normal    Data Review Lab Results  Component Value Date   WBC 5.5 12/02/2020   HGB 13.6  12/02/2020   HCT 42.7 12/02/2020   MCV 99.8 12/02/2020   PLT 205 12/02/2020   Lab Results  Component Value Date   NA 138 12/02/2020   K 4.2 12/02/2020   CL 104 12/02/2020   CO2 27 12/02/2020   BUN 14 12/02/2020   CREATININE 0.94 12/02/2020   GLUCOSE 88 12/02/2020   Lab Results  Component Value Date   INR 1.0 08/26/2018    Assessment/Plan: Cervical pseudoarthrosis, cervicalgia: I have discussed the situation with the patient.  I reviewed her imaging studies with her and pointed out the abnormalities.  We have discussed the various treatment options including a posterior cervical instrumentation and fusion.  I have described that surgery to her.  I have shown her surgical models.  We have discussed the risk, benefits, alternatives, expected postoperative course, and likelihood of achieving our goals with surgery.  I have answered all her questions.  She has decided proceed with surgery.   Cristi Loron 12/10/2020 10:32 AM

## 2020-12-10 NOTE — Anesthesia Postprocedure Evaluation (Signed)
Anesthesia Post Note  Patient: [name] [facility]  Procedure(s) Performed: POSTERIOR CERVICAL FUSION WITH LATERAL MASS FIXATION CERVICAL FIVE-SIX (Spine Cervical)     Patient location during evaluation: PACU Anesthesia Type: General Level of consciousness: awake and alert, oriented and patient cooperative Pain management: pain level controlled Vital Signs Assessment: post-procedure vital signs reviewed and stable Respiratory status: spontaneous breathing, nonlabored ventilation and respiratory function stable Cardiovascular status: blood pressure returned to baseline and stable Postop Assessment: no apparent nausea or vomiting Anesthetic complications: no   No notable events documented.  Last Vitals:  Vitals:   12/10/20 1417 12/10/20 1442  BP: 109/65 126/77  Pulse: 72 73  Resp: 14 18  Temp: 36.6 C 36.4 C  SpO2: 99% 95%    Last Pain:  Vitals:   12/10/20 1510  TempSrc:   PainSc: 7                  Lannie Fields

## 2020-12-11 ENCOUNTER — Encounter (HOSPITAL_COMMUNITY): Payer: Self-pay | Admitting: Neurosurgery

## 2020-12-11 MED ORDER — LIDOCAINE 2% (20 MG/ML) 5 ML SYRINGE
INTRAMUSCULAR | Status: AC
  Filled 2020-12-11: qty 5

## 2020-12-11 MED ORDER — DEXAMETHASONE SODIUM PHOSPHATE 10 MG/ML IJ SOLN
INTRAMUSCULAR | Status: AC
  Filled 2020-12-11: qty 1

## 2020-12-11 MED ORDER — PROPOFOL 10 MG/ML IV BOLUS
INTRAVENOUS | Status: AC
  Filled 2020-12-11: qty 40

## 2020-12-11 MED ORDER — HYDROCODONE-ACETAMINOPHEN 5-325 MG PO TABS: 1.0000 | ORAL_TABLET | ORAL | 0 refills | Status: AC | PRN

## 2020-12-11 MED ORDER — MIDAZOLAM HCL 2 MG/2ML IJ SOLN
INTRAMUSCULAR | Status: AC
  Filled 2020-12-11: qty 2

## 2020-12-11 MED ORDER — TIZANIDINE HCL 4 MG PO TABS: 4.0000 mg | ORAL_TABLET | Freq: Four times a day (QID) | ORAL | 1 refills | Status: AC | PRN

## 2020-12-11 MED ORDER — FENTANYL CITRATE (PF) 250 MCG/5ML IJ SOLN
INTRAMUSCULAR | Status: AC
  Filled 2020-12-11: qty 5

## 2020-12-11 MED ORDER — ONDANSETRON HCL 4 MG/2ML IJ SOLN
INTRAMUSCULAR | Status: AC
  Filled 2020-12-11: qty 2

## 2020-12-11 MED ORDER — PHENYLEPHRINE 40 MCG/ML (10ML) SYRINGE FOR IV PUSH (FOR BLOOD PRESSURE SUPPORT)
PREFILLED_SYRINGE | INTRAVENOUS | Status: AC
  Filled 2020-12-11: qty 10

## 2020-12-11 MED ORDER — SUCCINYLCHOLINE CHLORIDE 200 MG/10ML IV SOSY
PREFILLED_SYRINGE | INTRAVENOUS | Status: AC
  Filled 2020-12-11: qty 10

## 2020-12-11 MED ORDER — ROCURONIUM BROMIDE 10 MG/ML (PF) SYRINGE
PREFILLED_SYRINGE | INTRAVENOUS | Status: AC
  Filled 2020-12-11: qty 10

## 2020-12-11 NOTE — Plan of Care (Signed)
Pt doing well. Pt and husband given D/C instructions with verbal understanding. Rx's were sent to the pharmacy by MD. Pt's incision is clean and dry with no sign of infection. Pt's IV was removed prior to D/C. Pt D/C'd home via wheelchair per MD order. Pt will follow-up with Dr. Lovell Sheehan as scheduled. Pt is stable @ D/C and and has no other needs at this time. Rema Fendt, RN

## 2020-12-11 NOTE — Discharge Summary (Signed)
Physician Discharge Summary  Patient ID: [name] MRN: [id number] DOB/AGE: [birth year] 60 y.o.  Admit date: 12/10/2020 Discharge date: 12/11/2020  Admission Diagnoses: Cervical pseudoarthrosis, cervicalgia  Discharge Diagnoses: The same Active Problems:   Cervical pseudoarthrosis [facility])   Discharged Condition: good  Hospital Course: I performed a posterior C5-6 instrumentation and fusion on the patient on 12/10/2020.  The surgery went well.  The patient's postoperative course was unremarkable.  On postoperative day #1 she requested discharge home.  The patient, and her husband, were given oral and written discharge instructions.  All her questions were answered.  Consults: PT, OT, care management Significant Diagnostic Studies: None Treatments: Posterior C5-6 instrumentation and fusion. Discharge Exam: Blood pressure 97/60, pulse 71, temperature 97.8 F (36.6 C), resp. rate 18, height 5' 7.5" (1.715 m), weight 90.7 kg, SpO2 94 %. The patient is alert and pleasant.  Her strength is normal.  She looks well.  Disposition: Home  Discharge Instructions     Call MD for:  difficulty breathing, headache or visual disturbances   Complete by: As directed    Call MD for:  extreme fatigue   Complete by: As directed    Call MD for:  hives   Complete by: As directed    Call MD for:  persistant dizziness or light-headedness   Complete by: As directed    Call MD for:  persistant nausea and vomiting   Complete by: As directed    Call MD for:  redness, tenderness, or signs of infection (pain, swelling, redness, odor or green/yellow discharge around incision site)   Complete by: As directed    Call MD for:  severe uncontrolled pain   Complete by: As directed    Call MD for:  temperature >100.4   Complete by: As directed    Diet - low sodium heart healthy   Complete by: As directed    Discharge instructions   Complete by: As directed    Call [phone] for a followup  appointment. Take a stool softener while you are using pain medications.   Driving Restrictions   Complete by: As directed    Do not drive for 2 weeks.   Increase activity slowly   Complete by: As directed    Lifting restrictions   Complete by: As directed    Do not lift more than 5 pounds. No excessive bending or twisting.   May shower / Bathe   Complete by: As directed    Remove the dressing for 3 days after surgery.  You may shower, but leave the incision alone.   Remove dressing in 48 hours   Complete by: As directed       Allergies as of 12/11/2020       Reactions   Betadine Antibiotic-moisturize [bacitracin-polymyxin B] Itching, Swelling, Rash        Medication List     STOP taking these medications    diazepam 5 MG tablet Commonly known as: VALIUM   ibuprofen 600 MG tablet Commonly known as: ADVIL   methocarbamol 750 MG tablet Commonly known as: ROBAXIN   oxyCODONE 5 MG immediate release tablet Commonly known as: Oxy IR/ROXICODONE   traMADol 50 MG tablet Commonly known as: ULTRAM       TAKE these medications    acetaminophen 500 MG tablet Commonly known as: TYLENOL Take 2 tablets (1,000 mg total) by mouth every 6 (six) hours as needed for mild pain.   acidophilus Caps capsule Take 1 capsule by mouth daily.  ALPHA-LIPOIC ACID PO Take 1 tablet by mouth daily.   amitriptyline 50 MG tablet Commonly known as: ELAVIL Take 50 mg by mouth at bedtime.   CALCIUM-VITAMIN D PO Take 1 tablet by mouth daily.   clonazePAM 0.5 MG tablet Commonly known as: KLONOPIN Take 0.5 mg by mouth 2 (two) times daily.   docusate sodium 100 MG capsule Commonly known as: COLACE Take 1 capsule (100 mg total) by mouth 2 (two) times daily.   Elmiron 100 MG capsule Generic drug: pentosan polysulfate Take 100 mg by mouth daily.   gabapentin 300 MG capsule Commonly known as: NEURONTIN Take 900-1,200 mg by mouth See admin instructions. Take 1200 mg by mouth in the  morning and 900 mg in the afternoon and 900 mg at bedtime   HYDROcodone-acetaminophen 5-325 MG tablet Commonly known as: NORCO/VICODIN Take 1-2 tablets by mouth every 4 (four) hours as needed for moderate pain or severe pain.   hydroxypropyl methylcellulose / hypromellose 2.5 % ophthalmic solution Commonly known as: ISOPTO TEARS / GONIOVISC Place 1 drop into both eyes 3 (three) times daily as needed for dry eyes.   MAGNESIUM PO Take 1 tablet by mouth daily.   pilocarpine 5 MG tablet Commonly known as: SALAGEN Take 5 mg by mouth 3 (three) times daily.   polycarbophil 625 MG tablet Commonly known as: FIBERCON Take 625 mg by mouth daily.   pregabalin 100 MG capsule Commonly known as: LYRICA Take 100 mg by mouth 2 (two) times daily as needed for pain.   tiZANidine 4 MG tablet Commonly known as: ZANAFLEX Take 1 tablet (4 mg total) by mouth every 6 (six) hours as needed for muscle spasms.   venlafaxine XR 150 MG 24 hr capsule Commonly known as: EFFEXOR-XR Take 150 mg by mouth daily with breakfast.   vitamin C 500 MG tablet Commonly known as: ASCORBIC ACID Take 500 mg by mouth daily.   VITAMIN D3 PO Take 3 tablets by mouth daily.   Yuvafem 10 MCG Tabs vaginal tablet Generic drug: Estradiol Place 10 mcg vaginally 2 (two) times a week.   ZINC GLUCONATE PO Take 2 tablets by mouth daily.         Signed: Cristi Loron 12/11/2020, 10:14 AM

## 2020-12-11 NOTE — Evaluation (Signed)
Occupational Therapy Evaluation Patient Details Name: [name] MRN: [id number] DOB: 01/14/1961 Today's Date: 12/11/2020    History of Present Illness 60 yo female s/p C5-6. PMH including arthritis, CKD, and back sx (2019).   Clinical Impression   PTA, pt was living with her husband and was independent. Currently, pt requires Supervision for ADLs and functional mobility. Provided education and handout on cervical precautions, collar management, grooming, UB ADLs, LB ADLs, toileting, and shower  transfer with shower seat; pt demonstrated understanding. Answered all pt questions. Recommend dc home once medically stable per physician. All acute OT needs met and will sign off. Thank you.    Follow Up Recommendations  No OT follow up    Equipment Recommendations  None recommended by OT    Recommendations for Other Services PT consult     Precautions / Restrictions Precautions Precautions: Cervical;Fall Precaution Booklet Issued: Yes (comment) Precaution Comments: Providing education on cervical precautions and compensatory techniques for ADLs Required Braces or Orthoses: Cervical Brace Cervical Brace: Hard collar;At all times Restrictions Weight Bearing Restrictions: No      Mobility Bed Mobility Overal bed mobility: Needs Assistance Bed Mobility: Rolling;Sidelying to Sit;Sit to Sidelying Rolling: Supervision Sidelying to sit: Supervision     Sit to sidelying: Supervision General bed mobility comments: Supervision for safety; education on log roll    Transfers Overall transfer level: Needs assistance   Transfers: Sit to/from Stand Sit to Stand: Supervision         General transfer comment: supervision for safety    Balance Overall balance assessment: Needs assistance Sitting-balance support: No upper extremity supported;Feet supported Sitting balance-Leahy Scale: Good     Standing balance support: No upper extremity supported;During functional  activity Standing balance-Leahy Scale: Good                             ADL either performed or assessed with clinical judgement   ADL Overall ADL's : Needs assistance/impaired                                       General ADL Comments: Providing education on cervical precautions, collar management, grooming, bed mobility, UB ADLs, LB ADLs, toileting, and shower transfer.     Vision Baseline Vision/History: No visual deficits       Perception     Praxis      Pertinent Vitals/Pain Pain Assessment: Faces Faces Pain Scale: Hurts little more Pain Location: neck Pain Descriptors / Indicators: Discomfort;Grimacing Pain Intervention(s): Monitored during session;Repositioned     Hand Dominance Right   Extremity/Trunk Assessment Upper Extremity Assessment Upper Extremity Assessment: Overall WFL for tasks assessed   Lower Extremity Assessment Lower Extremity Assessment: Defer to PT evaluation   Cervical / Trunk Assessment Cervical / Trunk Assessment: Other exceptions Cervical / Trunk Exceptions: s/p cervical precautions   Communication Communication Communication: No difficulties   Cognition Arousal/Alertness: Lethargic;Suspect due to medications Behavior During Therapy: [facility] for tasks assessed/performed Overall Cognitive Status: Impaired/Different from baseline Area of Impairment: Problem solving                             Problem Solving: Slow processing General Comments: Pt reporting she feels very out of it due to pain medication this morning.   General Comments       Exercises  Shoulder Instructions      Home Living Family/patient expects to be discharged to:: Private residence Living Arrangements: Spouse/significant other;Children;Other relatives Available Help at Discharge: Family Type of Home: House Home Access: Stairs to enter [employer] of Steps: 4 Entrance Stairs-Rails: Left Home Layout: Two  level Alternate Level Stairs-Number of Steps: Flight Alternate Level Stairs-Rails: Right Bathroom Shower/Tub: Occupational psychologist: Standard     Home Equipment: Shower seat          Prior Functioning/Environment Level of Independence: Independent                 OT Problem List: Decreased activity tolerance;Impaired balance (sitting and/or standing);Decreased knowledge of use of DME or AE;Decreased knowledge of precautions;Pain      OT Treatment/Interventions:      OT Goals(Current goals can be found in the care plan section) Acute Rehab OT Goals Patient Stated Goal: Go home OT Goal Formulation: All assessment and education complete, DC therapy  OT Frequency:     Barriers to D/C:            Co-evaluation              AM-PAC OT "6 Clicks" Daily Activity     Outcome Measure Help from another person eating meals?: A Little Help from another person taking care of personal grooming?: A Little Help from another person toileting, which includes using toliet, bedpan, or urinal?: A Little Help from another person bathing (including washing, rinsing, drying)?: A Little Help from another person to put on and taking off regular upper body clothing?: A Little Help from another person to put on and taking off regular lower body clothing?: A Little 6 Click Score: 18   End of Session Equipment Utilized During Treatment: Gait belt;Cervical collar Nurse Communication: Mobility status  Activity Tolerance: Patient tolerated treatment well Patient left: in bed;with call bell/phone within reach  OT Visit Diagnosis: Unsteadiness on feet (R26.81);Other abnormalities of gait and mobility (R26.89);Muscle weakness (generalized) (M62.81);Pain Pain - part of body:  (neck; across shoulders)                Time: 4239-5320 OT Time Calculation (min): 22 min Charges:  OT General Charges $OT Visit: 1 Visit OT Evaluation $OT Eval Low Complexity: [place], OTR/L Acute Rehab Pager: [phone] Office: [place] 12/11/2020, 8:50 AM

## 2020-12-11 NOTE — Evaluation (Signed)
Physical Therapy Evaluation Patient Details Name: [name] MRN: [id number] DOB: 08-Sep-1960 Today's Date: 12/11/2020   History of Present Illness  Pt is a 60 y/o female who presents s/p C5-6 . PMH including arthritis, CKD, and back sx (2019).  Clinical Impression  Pt admitted with above diagnosis. At the time of PT eval, pt was able to demonstrate transfers and ambulation with gross min guard assist to supervision for safety and no AD. Stair training was focus of session as pt will need to negotiate a flight of stairs. She reports pulling herself up each step with BUE's on the railing PTA. With husband's assistance and 1 railing use, pt was able to negotiate a flight of 10 stairs during session with gross min guard assist to light min assist. Pt was educated on precautions, brace application/wearing schedule, appropriate activity progression, and car transfer. Pt currently with functional limitations due to the deficits listed below (see PT Problem List). Pt will benefit from skilled PT to increase their independence and safety with mobility to allow discharge to the venue listed below.      Follow Up Recommendations No PT follow up;Supervision for mobility/OOB    Equipment Recommendations  None recommended by PT    Recommendations for Other Services       Precautions / Restrictions Precautions Precautions: Cervical;Fall Precaution Booklet Issued: Yes (comment) Precaution Comments: Providing education on cervical precautions and compensatory techniques for ADLs Required Braces or Orthoses: Cervical Brace Cervical Brace: Hard collar;At all times Restrictions Weight Bearing Restrictions: No      Mobility  Bed Mobility Overal bed mobility: Needs Assistance Bed Mobility: Rolling;Sidelying to Sit;Sit to Sidelying Rolling: Supervision Sidelying to sit: Supervision     Sit to sidelying: Supervision General bed mobility comments: Supervision for optimal log roll technique.  HOB elevated. Pt and husband report pt will likely sleep in the recliner upon return home.    Transfers Overall transfer level: Needs assistance Equipment used: None Transfers: Sit to/from Stand Sit to Stand: Min guard         General transfer comment: Close guard for safety as pt powered up to full standing position.  Ambulation/Gait Ambulation/Gait assistance: Min guard;Supervision Gait Distance (Feet): 300 Feet Assistive device: None Gait Pattern/deviations: Step-through pattern;Decreased stride length;Trunk flexed Gait velocity: Decreased Gait velocity interpretation: <1.31 ft/sec, indicative of household ambulator General Gait Details: Min guard assist for balance and safety initially, fading to supervision as pt became more comfortable ambulating. No overt LOB noted however unsteady initially.  Stairs Stairs: Yes Stairs assistance: Min guard;Min assist Stair Management: One rail Right;Step to pattern;Forwards Number of Stairs: 10 General stair comments: Husband present and open to education. He was hands on with gait belt (issued at end of session) with therapist guarding both pt and husband during stair training. HHA from husband in addition to railing. Pt led up with the RLE and down with the LLE due to baseline knee impairments.  Wheelchair Mobility    Modified Rankin (Stroke Patients Only)       Balance Overall balance assessment: Needs assistance Sitting-balance support: No upper extremity supported;Feet supported Sitting balance-Leahy Scale: Good     Standing balance support: No upper extremity supported;During functional activity Standing balance-Leahy Scale: Fair Standing balance comment: Unsteadiness noted at times during dynamic activity however no assist required to recover.                             Pertinent Vitals/Pain Pain  Assessment: Faces Faces Pain Scale: Hurts even more Pain Location: neck and shoulders Pain Descriptors /  Indicators: Discomfort;Grimacing Pain Intervention(s): Limited activity within patient's tolerance;Monitored during session;Repositioned    Home Living Family/patient expects to be discharged to:: Private residence Living Arrangements: Spouse/significant other;Children;Other relatives Available Help at Discharge: Family Type of Home: House Home Access: Stairs to enter Entrance Stairs-Rails: Left Entrance Stairs-Number of Steps: 4 Home Layout: Two level Home Equipment: Shower seat      Prior Function Level of Independence: Independent               Hand Dominance   Dominant Hand: Right    Extremity/Trunk Assessment   Upper Extremity Assessment Upper Extremity Assessment: Defer to OT evaluation    Lower Extremity Assessment Lower Extremity Assessment: Generalized weakness (Consistent with pre-op diagnosis)    Cervical / Trunk Assessment Cervical / Trunk Assessment: Other exceptions Cervical / Trunk Exceptions: s/p cervical surgery  Communication   Communication: No difficulties  Cognition Arousal/Alertness: Lethargic;Suspect due to medications Behavior During Therapy: [facility] for tasks assessed/performed Overall Cognitive Status: Impaired/Different from baseline Area of Impairment: Problem solving                             Problem Solving: Slow processing General Comments: Pt reports due to meds      General Comments      Exercises     Assessment/Plan    PT Assessment Patient needs continued PT services  PT Problem List Decreased strength;Decreased activity tolerance;Decreased balance;Decreased mobility;Decreased knowledge of use of DME;Decreased safety awareness;Decreased knowledge of precautions;Pain       PT Treatment Interventions DME instruction;Gait training;Stair training;Functional mobility training;Therapeutic activities;Therapeutic exercise;Neuromuscular re-education;Patient/family education    PT Goals (Current goals can be found  in the Care Plan section)  Acute Rehab PT Goals Patient Stated Goal: Go home PT Goal Formulation: With patient/family Time For Goal Achievement: 12/18/20 Potential to Achieve Goals: Good    Frequency Min 5X/week   Barriers to discharge        Co-evaluation               AM-PAC PT "6 Clicks" Mobility  Outcome Measure Help needed turning from your back to your side while in a flat bed without using bedrails?: A Little Help needed moving from lying on your back to sitting on the side of a flat bed without using bedrails?: A Little Help needed moving to and from a bed to a chair (including a wheelchair)?: A Little Help needed standing up from a chair using your arms (e.g., wheelchair or bedside chair)?: A Little Help needed to walk in hospital room?: A Little Help needed climbing 3-5 steps with a railing? : A Little 6 Click Score: 18    End of Session Equipment Utilized During Treatment: Gait belt Activity Tolerance: Patient limited by lethargy Patient left: in bed;with call bell/phone within reach;with family/visitor present Nurse Communication: Mobility status PT Visit Diagnosis: Unsteadiness on feet (R26.81);Pain Pain - part of body:  (neck and shoulders)    Time: 5449-2010 PT Time Calculation (min) (ACUTE ONLY): 25 min   Charges:   PT Evaluation $PT Eval Low Complexity: 1 Low PT Treatments $Gait Training: 8-22 mins        Conni Slipper, PT, DPT Acute Rehabilitation Services Pager: [phone] Office: [phone]   Marylynn Pearson 12/11/2020, 3:06 PM

## 2020-12-12 LAB — TYPE AND SCREEN
ABO/RH(D): O NEG
Antibody Screen: POSITIVE
Donor AG Type: NEGATIVE
Donor AG Type: NEGATIVE
PT AG Type: NEGATIVE
Unit division: 0
Unit division: 0

## 2020-12-12 LAB — BPAM RBC
Blood Product Expiration Date: 202209142359
Blood Product Expiration Date: 202209152359
Unit Type and Rh: 9500
Unit Type and Rh: 9500

## 2021-08-12 ENCOUNTER — Other Ambulatory Visit: Payer: Self-pay | Admitting: Neurosurgery

## 2021-08-12 DIAGNOSIS — G8929 Other chronic pain: Secondary | ICD-10-CM

## 2021-08-22 ENCOUNTER — Other Ambulatory Visit: Payer: BC Managed Care – PPO

## 2021-08-23 ENCOUNTER — Ambulatory Visit
Admission: RE | Admit: 2021-08-23 | Discharge: 2021-08-23 | Disposition: A | Discharge status: Home / Self Care | Payer: BC Managed Care – PPO | Source: Ambulatory Visit | Attending: Neurosurgery | Admitting: Neurosurgery

## 2021-08-23 DIAGNOSIS — G8929 Other chronic pain: Secondary | ICD-10-CM

## 2021-08-29 ENCOUNTER — Other Ambulatory Visit: Payer: BC Managed Care – PPO

## 2021-11-11 ENCOUNTER — Other Ambulatory Visit: Payer: Self-pay | Admitting: Student

## 2021-11-11 DIAGNOSIS — M5412 Radiculopathy, cervical region: Secondary | ICD-10-CM

## 2021-11-22 ENCOUNTER — Ambulatory Visit
Admission: RE | Admit: 2021-11-22 | Discharge: 2021-11-22 | Disposition: A | Discharge status: Home / Self Care | Payer: BC Managed Care – PPO | Source: Ambulatory Visit | Attending: Student | Admitting: Student

## 2021-11-22 DIAGNOSIS — M5412 Radiculopathy, cervical region: Secondary | ICD-10-CM

## 2022-02-05 ENCOUNTER — Ambulatory Visit: Payer: BC Managed Care – PPO | Admitting: Podiatry

## 2022-02-05 ENCOUNTER — Ambulatory Visit (INDEPENDENT_AMBULATORY_CARE_PROVIDER_SITE_OTHER): Payer: BC Managed Care – PPO

## 2022-02-05 DIAGNOSIS — M79672 Pain in left foot: Secondary | ICD-10-CM

## 2022-02-05 DIAGNOSIS — M778 Other enthesopathies, not elsewhere classified: Secondary | ICD-10-CM

## 2022-02-05 DIAGNOSIS — M79671 Pain in right foot: Secondary | ICD-10-CM | POA: Diagnosis not present

## 2022-02-05 DIAGNOSIS — M722 Plantar fascial fibromatosis: Secondary | ICD-10-CM

## 2022-02-05 NOTE — Patient Instructions (Addendum)
For your flatfoot I would try a [place] with a wider toe box or a shoe with a softer material in the toe box.   For instructions on how to put on your Plantar Fascial Brace, please visit [email or web address]    Plantar Fasciitis (Heel Spur Syndrome) with Rehab The plantar fascia is a fibrous, ligament-like, soft-tissue structure that spans the bottom of the foot. Plantar fasciitis is a condition that causes pain in the foot due to inflammation of the tissue. SYMPTOMS  Pain and tenderness on the underneath side of the foot. Pain that worsens with standing or walking. CAUSES  Plantar fasciitis is caused by irritation and injury to the plantar fascia on the underneath side of the foot. Common mechanisms of injury include: Direct trauma to bottom of the foot. Damage to a small nerve that runs under the foot where the main fascia attaches to the heel bone. Stress placed on the plantar fascia due to bone spurs. RISK INCREASES WITH:  Activities that place stress on the plantar fascia (running, jumping, pivoting, or cutting). Poor strength and flexibility. Improperly fitted shoes. Tight calf muscles. Flat feet. Failure to warm-up properly before activity. Obesity. PREVENTION Warm up and stretch properly before activity. Allow for adequate recovery between workouts. Maintain physical fitness: Strength, flexibility, and endurance. Cardiovascular fitness. Maintain a health body weight. Avoid stress on the plantar fascia. Wear properly fitted shoes, including arch supports for individuals who have flat feet.  PROGNOSIS  If treated properly, then the symptoms of plantar fasciitis usually resolve without surgery. However, occasionally surgery is necessary.  RELATED COMPLICATIONS  Recurrent symptoms that may result in a chronic condition. Problems of the lower back that are caused by compensating for the injury, such as limping. Pain or weakness of the foot during push-off following  surgery. Chronic inflammation, scarring, and partial or complete fascia tear, occurring more often from repeated injections.  TREATMENT  Treatment initially involves the use of ice and medication to help reduce pain and inflammation. The use of strengthening and stretching exercises may help reduce pain with activity, especially stretches of the Achilles tendon. These exercises may be performed at home or with a therapist. Your caregiver may recommend that you use heel cups of arch supports to help reduce stress on the plantar fascia. Occasionally, corticosteroid injections are given to reduce inflammation. If symptoms persist for greater than 6 months despite non-surgical (conservative), then surgery may be recommended.   MEDICATION  If pain medication is necessary, then nonsteroidal anti-inflammatory medications, such as aspirin and ibuprofen, or other minor pain relievers, such as acetaminophen, are often recommended. Do not take pain medication within 7 days before surgery. Prescription pain relievers may be given if deemed necessary by your caregiver. Use only as directed and only as much as you need. Corticosteroid injections may be given by your caregiver. These injections should be reserved for the most serious cases, because they may only be given a certain number of times.  HEAT AND COLD Cold treatment (icing) relieves pain and reduces inflammation. Cold treatment should be applied for 10 to 15 minutes every 2 to 3 hours for inflammation and pain and immediately after any activity that aggravates your symptoms. Use ice packs or massage the area with a piece of ice (ice massage). Heat treatment may be used prior to performing the stretching and strengthening activities prescribed by your caregiver, physical therapist, or athletic trainer. Use a heat pack or soak the injury in warm water.  SEEK IMMEDIATE MEDICAL CARE IF: Treatment  seems to offer no benefit, or the condition worsens. Any  medications produce adverse side effects.  EXERCISES- RANGE OF MOTION (ROM) AND STRETCHING EXERCISES - Plantar Fasciitis (Heel Spur Syndrome) These exercises may help you when beginning to rehabilitate your injury. Your symptoms may resolve with or without further involvement from your physician, physical therapist or athletic trainer. While completing these exercises, remember:  Restoring tissue flexibility helps normal motion to return to the joints. This allows healthier, less painful movement and activity. An effective stretch should be held for at least 30 seconds. A stretch should never be painful. You should only feel a gentle lengthening or release in the stretched tissue.  RANGE OF MOTION - Toe Extension, Flexion Sit with your right / left leg crossed over your opposite knee. Grasp your toes and gently pull them back toward the top of your foot. You should feel a stretch on the bottom of your toes and/or foot. Hold this stretch for 10 seconds. Now, gently pull your toes toward the bottom of your foot. You should feel a stretch on the top of your toes and or foot. Hold this stretch for 10 seconds. Repeat  times. Complete this stretch 3 times per day.   RANGE OF MOTION - Ankle Dorsiflexion, Active Assisted Remove shoes and sit on a chair that is preferably not on a carpeted surface. Place right / left foot under knee. Extend your opposite leg for support. Keeping your heel down, slide your right / left foot back toward the chair until you feel a stretch at your ankle or calf. If you do not feel a stretch, slide your bottom forward to the edge of the chair, while still keeping your heel down. Hold this stretch for 10 seconds. Repeat 3 times. Complete this stretch 2 times per day.   STRETCH  Gastroc, Standing Place hands on wall. Extend right / left leg, keeping the front knee somewhat bent. Slightly point your toes inward on your back foot. Keeping your right / left heel on the  floor and your knee straight, shift your weight toward the wall, not allowing your back to arch. You should feel a gentle stretch in the right / left calf. Hold this position for 10 seconds. Repeat 3 times. Complete this stretch 2 times per day.  STRETCH  Soleus, Standing Place hands on wall. Extend right / left leg, keeping the other knee somewhat bent. Slightly point your toes inward on your back foot. Keep your right / left heel on the floor, bend your back knee, and slightly shift your weight over the back leg so that you feel a gentle stretch deep in your back calf. Hold this position for 10 seconds. Repeat 3 times. Complete this stretch 2 times per day.  STRETCH  Gastrocsoleus, Standing  Note: This exercise can place a lot of stress on your foot and ankle. Please complete this exercise only if specifically instructed by your caregiver.  Place the ball of your right / left foot on a step, keeping your other foot firmly on the same step. Hold on to the wall or a rail for balance. Slowly lift your other foot, allowing your body weight to press your heel down over the edge of the step. You should feel a stretch in your right / left calf. Hold this position for 10 seconds. Repeat this exercise with a slight bend in your right / left knee. Repeat 3 times. Complete this stretch 2 times per day.   STRENGTHENING EXERCISES -  Plantar Fasciitis (Heel Spur Syndrome)  These exercises may help you when beginning to rehabilitate your injury. They may resolve your symptoms with or without further involvement from your physician, physical therapist or athletic trainer. While completing these exercises, remember:  Muscles can gain both the endurance and the strength needed for everyday activities through controlled exercises. Complete these exercises as instructed by your physician, physical therapist or athletic trainer. Progress the resistance and repetitions only as guided.  STRENGTH - Towel  Curls Sit in a chair positioned on a non-carpeted surface. Place your foot on a towel, keeping your heel on the floor. Pull the towel toward your heel by only curling your toes. Keep your heel on the floor. Repeat 3 times. Complete this exercise 2 times per day.  STRENGTH - Ankle Inversion Secure one end of a rubber exercise band/tubing to a fixed object (table, pole). Loop the other end around your foot just before your toes. Place your fists between your knees. This will focus your strengthening at your ankle. Slowly, pull your big toe up and in, making sure the band/tubing is positioned to resist the entire motion. Hold this position for 10 seconds. Have your muscles resist the band/tubing as it slowly pulls your foot back to the starting position. Repeat 3 times. Complete this exercises 2 times per day.  Document Released: 04/12/2005 Document Revised: 07/05/2011 Document Reviewed: 07/25/2008 [employer] Patient Information 2014 [employer].  

## 2022-02-05 NOTE — Progress Notes (Unsigned)
Subjective:   Patient ID: [name], female   DOB: 61 y.o.   MRN: [id number]   HPI Chief Complaint  Patient presents with   Foot Pain    Patient is having pain in the bilateral arch pain, and right foot top of the foot pain, has some swelling, started 3 months ago, Rate of pain 9 out of 10, TX: insoles, new shoes, pain meds, icing, heat, X-Rays done today    61 year old female presents the office today with concerns of pain to the top of her right foot as well as the arch of her foot.  She been doing physical therapy for her neck and she is asking for different exercises for her feet.  She has tried new shoes and inserts which helps some but is not limiting her symptoms.  No recent injuries that she reports.   She had a history of plantar fasciitis years ago and she a history of left ankle fracture.   Review of Systems  All other systems reviewed and are negative.  Past Medical History:  Diagnosis Date   Arthritis    Chronic kidney disease    GERD (gastroesophageal reflux disease)    H/O neck surgery    History of kidney stones    Interstitial cystitis    PONV (postoperative nausea and vomiting)     Past Surgical History:  Procedure Laterality Date   ABDOMINAL HYSTERECTOMY     BACK SURGERY     02/2018   neck durgery     POSTERIOR CERVICAL FUSION/FORAMINOTOMY N/A 12/10/2020   Procedure: POSTERIOR CERVICAL FUSION WITH LATERAL MASS FIXATION CERVICAL FIVE-SIX;  Surgeon: Newman Pies, MD;  Location: [facility];  Service: Neurosurgery;  Laterality: N/A;     Current Outpatient Medications:    acetaminophen (TYLENOL) 500 MG tablet, Take 2 tablets (1,000 mg total) by mouth every 6 (six) hours as needed for mild pain., Disp: , Rfl:    acidophilus (RISAQUAD) CAPS capsule, Take 1 capsule by mouth daily., Disp: , Rfl:    ALPHA-LIPOIC ACID PO, Take 1 tablet by mouth daily., Disp: , Rfl:    amitriptyline (ELAVIL) 50 MG tablet, Take 50 mg by mouth at bedtime., Disp: , Rfl:     CALCIUM-VITAMIN D PO, Take 1 tablet by mouth daily., Disp: , Rfl:    Cholecalciferol (VITAMIN D3 PO), Take 3 tablets by mouth daily., Disp: , Rfl:    clonazePAM (KLONOPIN) 0.5 MG tablet, Take 0.5 mg by mouth 2 (two) times daily., Disp: , Rfl:    ELMIRON 100 MG capsule, Take 100 mg by mouth daily., Disp: , Rfl:    gabapentin (NEURONTIN) 300 MG capsule, Take 900-1,200 mg by mouth See admin instructions. Take 1200 mg by mouth in the morning and 900 mg in the afternoon and 900 mg at bedtime, Disp: , Rfl:    HYDROcodone-acetaminophen (NORCO/VICODIN) 5-325 MG tablet, Take 1-2 tablets by mouth every 4 (four) hours as needed for moderate pain or severe pain., Disp: 30 tablet, Rfl: 0   hydroxypropyl methylcellulose / hypromellose (ISOPTO TEARS / GONIOVISC) 2.5 % ophthalmic solution, Place 1 drop into both eyes 3 (three) times daily as needed for dry eyes., Disp: , Rfl:    MAGNESIUM PO, Take 1 tablet by mouth daily., Disp: , Rfl:    pilocarpine (SALAGEN) 5 MG tablet, Take 5 mg by mouth 3 (three) times daily., Disp: , Rfl:    polycarbophil (FIBERCON) 625 MG tablet, Take 625 mg by mouth daily., Disp: , Rfl:  tiZANidine (ZANAFLEX) 4 MG tablet, Take 1 tablet (4 mg total) by mouth every 6 (six) hours as needed for muscle spasms., Disp: 50 tablet, Rfl: 1   venlafaxine XR (EFFEXOR-XR) 150 MG 24 hr capsule, Take 150 mg by mouth daily with breakfast., Disp: , Rfl:    vitamin C (ASCORBIC ACID) 500 MG tablet, Take 500 mg by mouth daily., Disp: , Rfl:    YUVAFEM 10 MCG TABS vaginal tablet, Place 10 mcg vaginally 2 (two) times a week., Disp: , Rfl:    ZINC GLUCONATE PO, Take 2 tablets by mouth daily., Disp: , Rfl:   Allergies  Allergen Reactions   Betadine Antibiotic-Moisturize [Bacitracin-Polymyxin B] Itching, Swelling and Rash           Objective:  Physical Exam  General: AAO x3, NAD  Dermatological: Skin is warm, dry and supple bilateral.  There are no open sores, no preulcerative lesions, no rash or  signs of infection present.  Vascular: Dorsalis Pedis artery and Posterior Tibial artery pedal pulses are 2/4 bilateral with immedate capillary fill time.  There is no pain with calf compression, swelling, warmth, erythema.   Neruologic: Grossly intact via light touch bilateral.  Negative Tinel sign.  Musculoskeletal: Tenderness palpation on dorsal aspect of the right foot more along Lisfranc joint.  There is no specific area pinpoint tenderness.  She does get discomfort on the arch of the foot as well as along the medial band of plantar fascia and in the insertion into the calcaneus.  There is a decreased medial arch upon weightbearing.  Flexor, extensor tendons appear to be intact.  Muscular strength 5/5 in all groups tested bilateral.  Gait: Unassisted, Nonantalgic.       Assessment:   Capsulitis midfoot, Plantar fasciitis     Plan:  -Treatment options discussed including all alternatives, risks, and complications -Etiology of symptoms were discussed -X-rays were obtained and reviewed with the patient.  Arthritic changes present the Lisfranc joint bilaterally.  No evidence of acute fracture. -Discussed daily injection we held off on this today.  Discussed oral steroids or other medications.  She is continue topical Voltaren.  Discussed traction, icing.  [facility]ling.  We discussed using good arch support as well.  Discussed different types of shoes as well that may be beneficial.  Plantar fascial braces were dispensed x2.    Vivi Barrack DPM

## 2022-02-23 ENCOUNTER — Other Ambulatory Visit: Payer: Self-pay | Admitting: Podiatry

## 2022-02-23 DIAGNOSIS — M722 Plantar fascial fibromatosis: Secondary | ICD-10-CM

## 2022-05-07 ENCOUNTER — Other Ambulatory Visit: Payer: Self-pay | Admitting: Obstetrics & Gynecology

## 2022-05-07 DIAGNOSIS — N644 Mastodynia: Secondary | ICD-10-CM

## 2022-05-13 ENCOUNTER — Other Ambulatory Visit (HOSPITAL_COMMUNITY): Payer: Self-pay

## 2022-05-28 ENCOUNTER — Other Ambulatory Visit: Payer: BC Managed Care – PPO

## 2022-11-26 IMAGING — MR MR LUMBAR SPINE W/O CM
4 of 5 series · 26 of 48 positions shown · non-contrast
Comparison: Radiographs 08/04/2021 and MRI lumbar spine 10/04/2017

CLINICAL DATA: Right low back pain with right hip and leg pain for
6 weeks

EXAM:
MRI LUMBAR SPINE WITHOUT CONTRAST
TECHNIQUE: Multiplanar, multisequence MR imaging of the lumbar spine was
performed. No intravenous contrast was administered.

[Series 3: T2 · sagittal · 4.0mm · 0.53mm/px · 6 of 15 slices shown (1 of 2)]
[im 1/15]
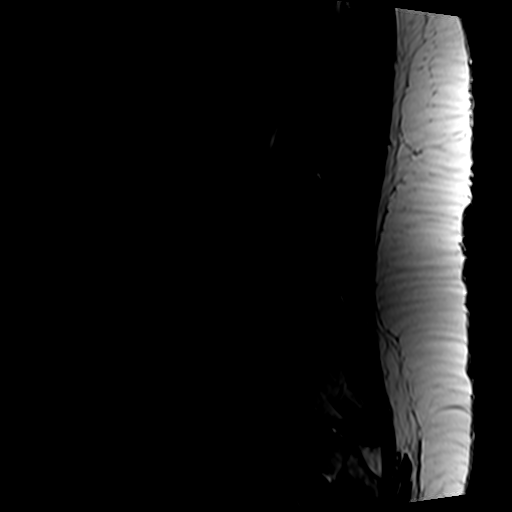
[im 3/15]
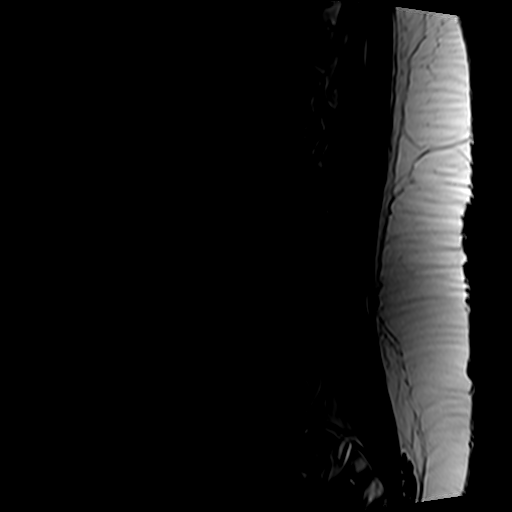
[im 6/15]
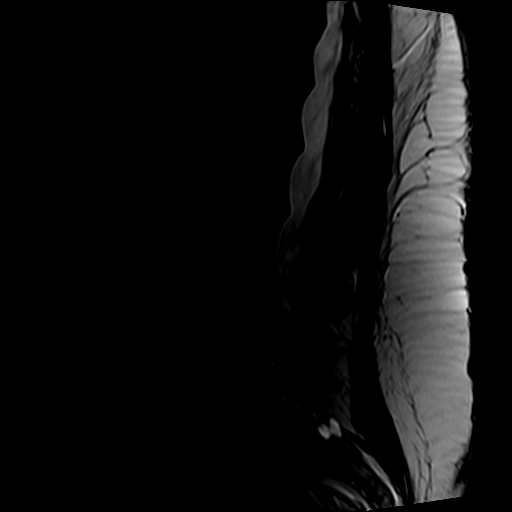
[im 9/15]
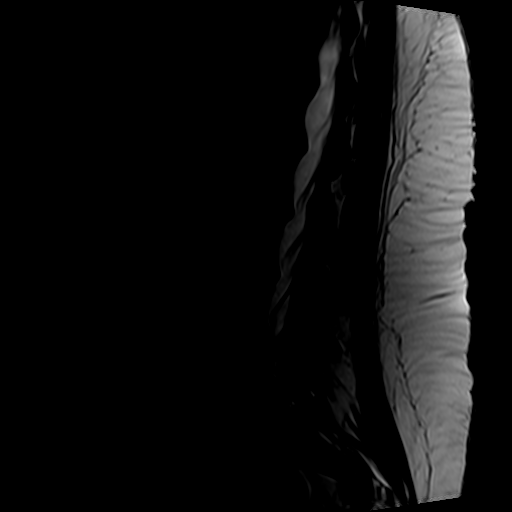
[im 12/15]
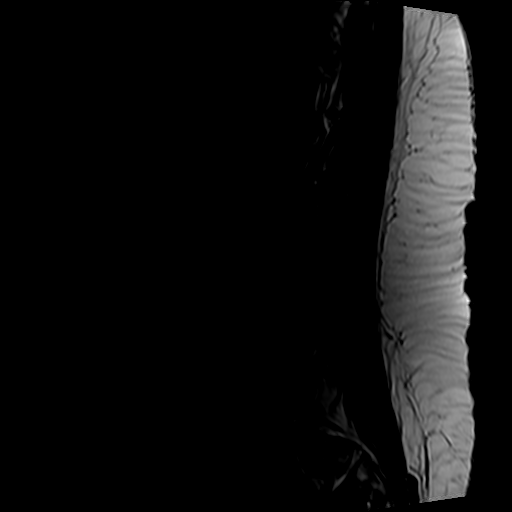
[im 15/15]
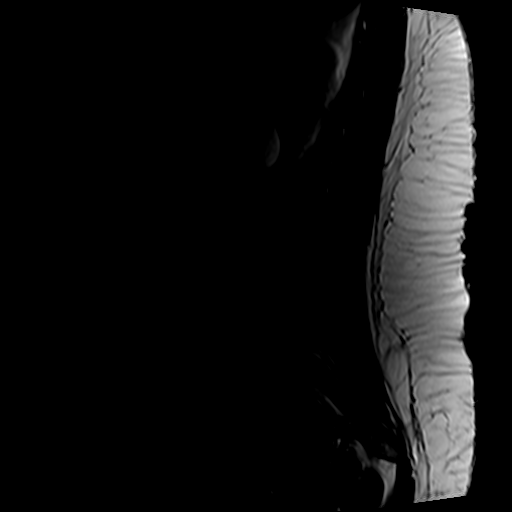

[Series 5: T1 · sagittal · 4.0mm · 0.53mm/px · 6 of 15 slices shown (1 of 2)]
[im 1/15]
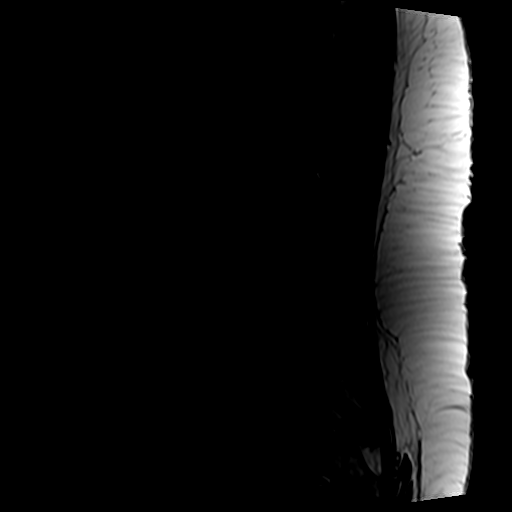
[im 3/15]
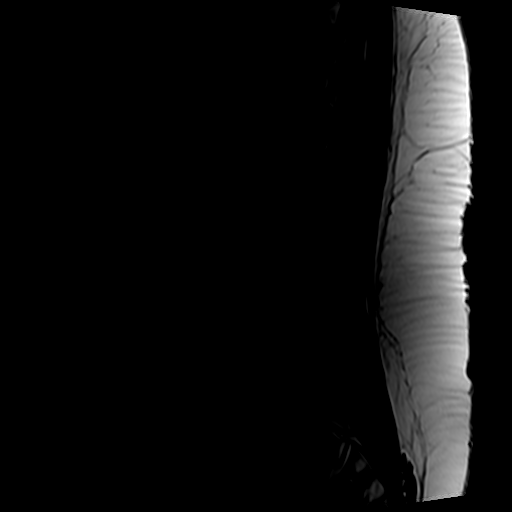
[im 6/15]
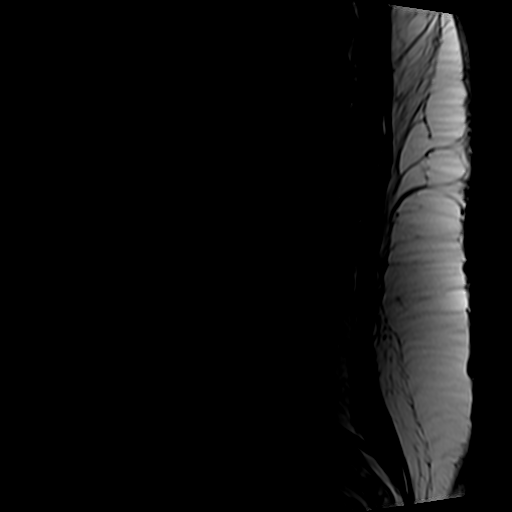
[im 9/15]
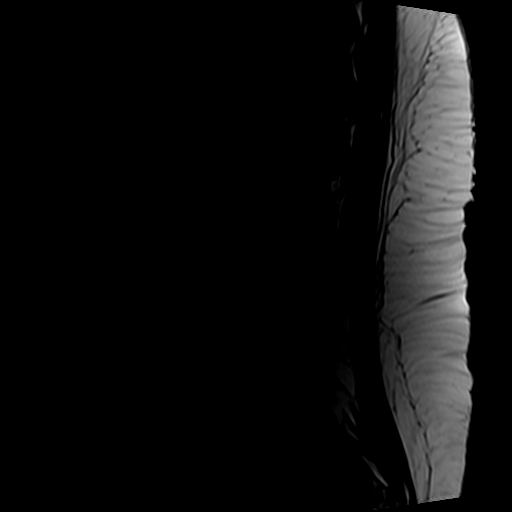
[im 12/15]
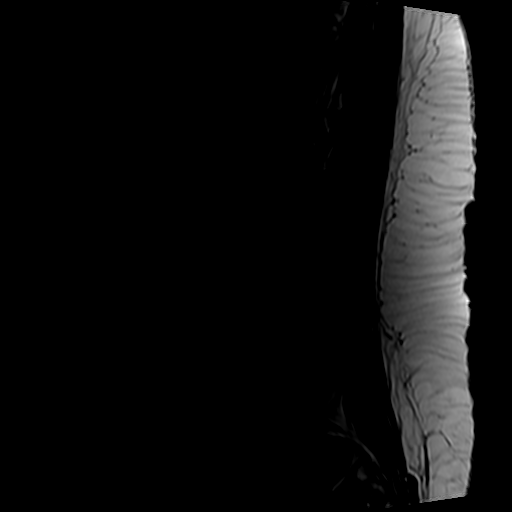
[im 15/15]
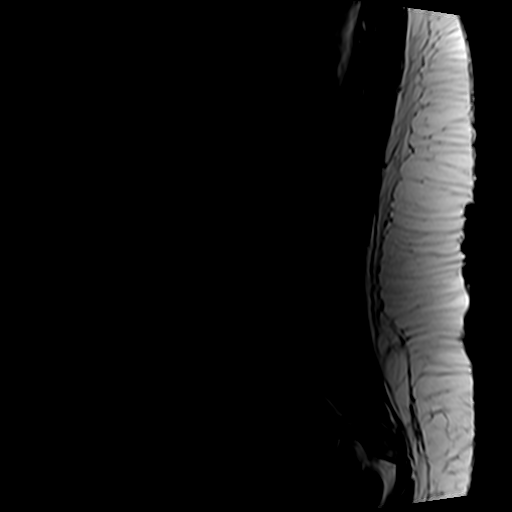

[Series 6: T2 · axial · 4.0mm · 0.70mm/px · z∈[-47,+169]mm · 9 of 38 slices shown (2 of 2)]
[im 1/38]
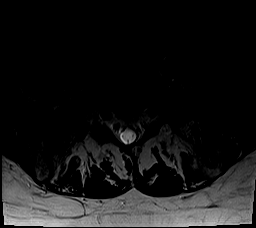
[im 6/38]
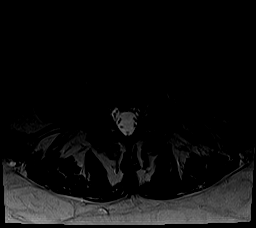
[im 11/38]
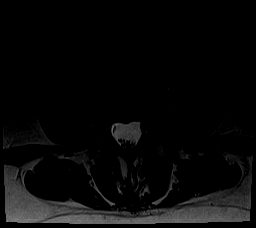
[im 16/38]
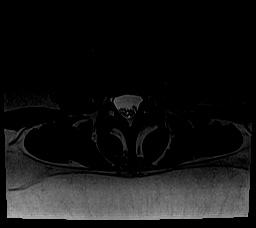
[im 19/38]
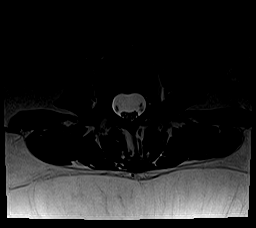
[im 22/38]
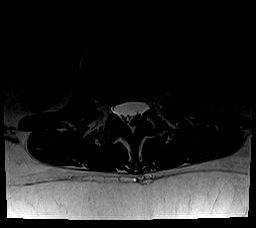
[im 27/38]
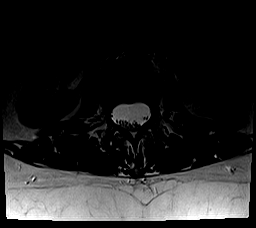
[im 32/38]
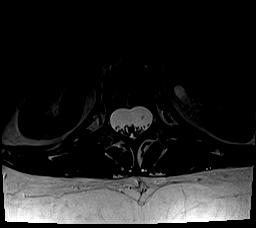
[im 38/38]
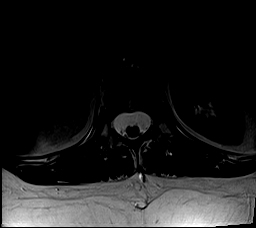

[Series 7: T1 · axial · 4.0mm · 0.35mm/px · z∈[-47,+138]mm · 5 of 38 slices shown (2 of 2)]
[im 1/38]
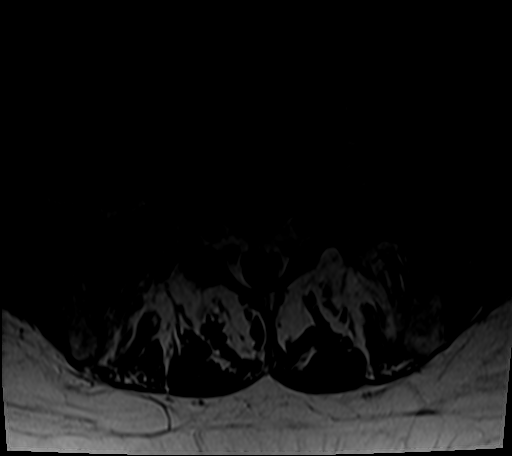
[im 6/38]
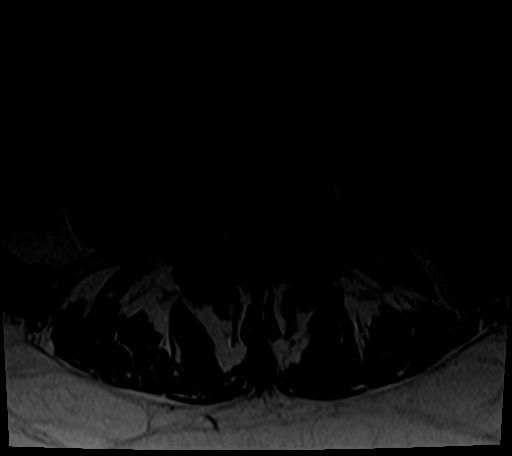
[im 11/38]
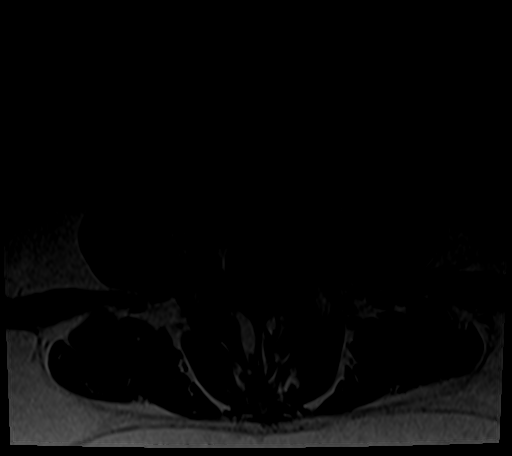
[im 19/38]
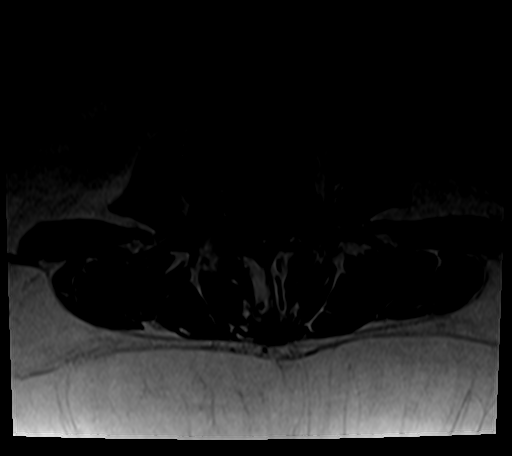
[im 32/38]
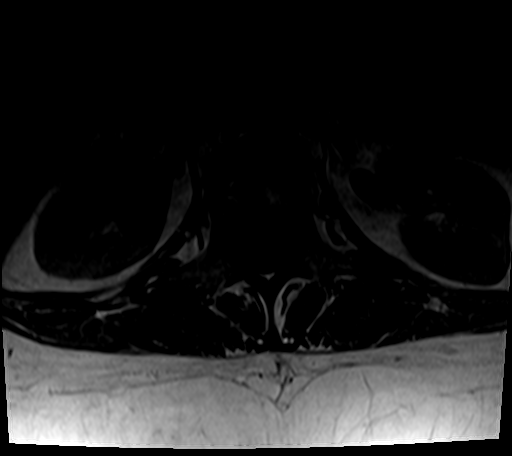

[26 of 48 positions shown; findings below may reference images not displayed]

FINDINGS: Segmentation: The lowest lumbar type non-rib-bearing vertebra is
labeled as L5.

Alignment: 3 mm degenerative retrolisthesis at L1-2, L2-3 and L3-4
with 4 mm degenerative retrolisthesis at L4-5.

Vertebrae: Type 1 degenerative endplate findings at L3-4 potentially
with an adjacent Schmorl's node along the superior endplate of L4.

Conus medullaris and cauda equina: Conus extends to the L1-2 level.
Conus and cauda equina appear normal.

Paraspinal and other soft tissues: 4 mm T2 hyperintensity in the
right hepatic lobe on image 2 of series 6 is compatible with a tiny
cyst and is unchanged from 02/25/2020 abdomen CT.

Disc levels:

T12-L1: No impingement.  Mild disc bulge.

L1-2: No impingement.  Mild disc bulge.

L2-3: No impingement. Mild disc bulge and small central annular
tear.

L3-4: Mild left and borderline right subarticular lateral recess
stenosis related to disc bulge and mild degenerative facet
arthropathy.

L4-5: Borderline bilateral subarticular lateral recess stenosis due
to disc bulge, intervertebral spurring, and facet arthropathy. Trace
left facet joint effusion.

L5-S1: Borderline bilateral foraminal stenosis due to intervertebral
spurring, disc bulge, facet arthropathy.
IMPRESSION: 1. Lumbar spondylosis and degenerative disc disease, with mild
impingement at L3-4 and borderline impingement at L4-5 at L5-S1 is
noted above.
2. Multilevel mild grade 1 degenerative retrolisthesis.

## 2023-03-29 ENCOUNTER — Other Ambulatory Visit: Payer: Self-pay | Admitting: Internal Medicine

## 2023-03-29 DIAGNOSIS — M5416 Radiculopathy, lumbar region: Secondary | ICD-10-CM

## 2023-03-29 DIAGNOSIS — M5431 Sciatica, right side: Secondary | ICD-10-CM

## 2023-03-30 ENCOUNTER — Encounter: Payer: Self-pay | Admitting: Internal Medicine

## 2023-03-31 ENCOUNTER — Ambulatory Visit
Admission: RE | Admit: 2023-03-31 | Discharge: 2023-03-31 | Disposition: A | Discharge status: Home / Self Care | Payer: No Typology Code available for payment source | Source: Ambulatory Visit | Attending: Internal Medicine | Admitting: Internal Medicine

## 2023-03-31 DIAGNOSIS — M5431 Sciatica, right side: Secondary | ICD-10-CM

## 2023-03-31 DIAGNOSIS — M5416 Radiculopathy, lumbar region: Secondary | ICD-10-CM

## 2023-06-06 ENCOUNTER — Ambulatory Visit (INDEPENDENT_AMBULATORY_CARE_PROVIDER_SITE_OTHER): Payer: Commercial Managed Care - PPO | Admitting: Podiatry

## 2023-06-06 ENCOUNTER — Ambulatory Visit (INDEPENDENT_AMBULATORY_CARE_PROVIDER_SITE_OTHER): Payer: Commercial Managed Care - PPO

## 2023-06-06 ENCOUNTER — Encounter: Payer: Self-pay | Admitting: Podiatry

## 2023-06-06 DIAGNOSIS — M25571 Pain in right ankle and joints of right foot: Secondary | ICD-10-CM | POA: Diagnosis not present

## 2023-06-06 DIAGNOSIS — M7671 Peroneal tendinitis, right leg: Secondary | ICD-10-CM | POA: Diagnosis not present

## 2023-06-06 DIAGNOSIS — M722 Plantar fascial fibromatosis: Secondary | ICD-10-CM

## 2023-06-06 MED ORDER — BETAMETHASONE SOD PHOS & ACET 6 (3-3) MG/ML IJ SUSP
3.0000 mg | Freq: Once | INTRAMUSCULAR | Status: AC
  Administered 2023-06-06: 3 mg via INTRA_ARTICULAR

## 2023-06-06 NOTE — Progress Notes (Signed)
   Chief Complaint  Patient presents with   Ankle Pain    Patient states that the last six weeks her right ankle has been hurting.no medication for pain    HPI: 63 y.o. female presenting today for evaluation of pain and tenderness associated to the ankle right lower extremity ongoing for several weeks now.  Patient states of the last 6 weeks she began to increase her activity and walking and exercise.  With increased activity she began to notice increased pain to the right ankle.  No history of injury.  She has not done anything for treatment  Past Medical History:  Diagnosis Date   Arthritis    Chronic kidney disease    GERD (gastroesophageal reflux disease)    H/O neck surgery    History of kidney stones    Interstitial cystitis    PONV (postoperative nausea and vomiting)     Past Surgical History:  Procedure Laterality Date   ABDOMINAL HYSTERECTOMY     BACK SURGERY     02/2018   neck durgery     POSTERIOR CERVICAL FUSION/FORAMINOTOMY N/A 12/10/2020   Procedure: POSTERIOR CERVICAL FUSION WITH LATERAL MASS FIXATION CERVICAL FIVE-SIX;  Surgeon: Garry Kansas, MD;  Location: [facility] OR;  Service: Neurosurgery;  Laterality: N/A;    Allergies  Allergen Reactions   Betadine Antibiotic-Moisturize [Bacitracin -Polymyxin B] Itching, Swelling and Rash     Physical Exam: General: The patient is alert and oriented x3 in no acute distress.  Dermatology: Skin is warm, dry and supple bilateral lower extremities.   Vascular: Palpable pedal pulses bilaterally. Capillary refill within normal limits.  No appreciable edema.  No erythema.  Neurological: Grossly intact via light touch  Musculoskeletal Exam: Pes planovalgus deformity noted with hypermobility of the subtalar joint with inversion eversion of the foot.  There is tenderness to palpation along the peroneal tendon right  Radiographic Exam RT ankle 06/06/2023:  Normal osseous mineralization. Joint spaces preserved.  No fractures or  osseous irregularities noted.  Impression: Negative  Assessment/Plan of Care: 1.  Pes planovalgus deformity RLE 2.  History of plantar fasciitis LLE 3.  Peroneal tendinitis right  -Patient evaluated.  X-rays reviewed -Injection of 0.5 cc Celestone  Soluspan injected into the peroneal tendon sheath right just posterior to the lateral malleolus -Patient declined any oral anti-inflammatories -Recommend good supportive tennis shoes and sneakers -Ankle brace dispensed.  Wear daily -Continue plantar fascial brace left foot for her chronic plantar fasciitis which seems to help -Patient may want to consider arch supports to support the medial longitudinal arch of the foot and alleviate her pes planovalgus deformity -Return to clinic as needed       Dot Gazella, DPM [facility]  Dr. Dot Gazella, DPM    [address] [place], [place] [address]                Office [phone]  Fax [phone]

## 2023-06-16 ENCOUNTER — Encounter (HOSPITAL_BASED_OUTPATIENT_CLINIC_OR_DEPARTMENT_OTHER): Payer: Self-pay

## 2023-06-16 ENCOUNTER — Emergency Department (HOSPITAL_BASED_OUTPATIENT_CLINIC_OR_DEPARTMENT_OTHER): Payer: No Typology Code available for payment source

## 2023-06-16 ENCOUNTER — Other Ambulatory Visit: Payer: Self-pay

## 2023-06-16 ENCOUNTER — Emergency Department (HOSPITAL_BASED_OUTPATIENT_CLINIC_OR_DEPARTMENT_OTHER)
Admission: EM | Admit: 2023-06-16 | Discharge: 2023-06-16 | Disposition: A | Discharge status: Home / Self Care | Payer: No Typology Code available for payment source | Attending: Emergency Medicine | Admitting: Emergency Medicine

## 2023-06-16 DIAGNOSIS — R102 Pelvic and perineal pain: Secondary | ICD-10-CM | POA: Diagnosis present

## 2023-06-16 DIAGNOSIS — N3000 Acute cystitis without hematuria: Secondary | ICD-10-CM | POA: Insufficient documentation

## 2023-06-16 LAB — URINALYSIS, W/ REFLEX TO CULTURE (INFECTION SUSPECTED)
Bacteria, UA: NONE SEEN
Bilirubin Urine: NEGATIVE
Glucose, UA: NEGATIVE mg/dL
Ketones, ur: NEGATIVE mg/dL
Nitrite: NEGATIVE
Protein, ur: NEGATIVE mg/dL
Specific Gravity, Urine: <1.005 — ABNORMAL LOW (ref 1.005–1.030)
WBC, UA: >50 WBC/hpf (ref 0–5)
pH: 5.5 (ref 5.0–8.0)

## 2023-06-16 LAB — BASIC METABOLIC PANEL
Anion gap: 7 (ref 5–15)
BUN: 18 mg/dL (ref 8–23)
CO2: 28 mmol/L (ref 22–32)
Calcium: 9.5 mg/dL (ref 8.9–10.3)
Chloride: 104 mmol/L (ref 98–111)
Creatinine, Ser: 1.21 mg/dL — ABNORMAL HIGH (ref 0.44–1.00)
GFR, Estimated: 51 mL/min — ABNORMAL LOW (ref >60)
Glucose, Bld: 101 mg/dL — ABNORMAL HIGH (ref 70–99)
Potassium: 3.8 mmol/L (ref 3.5–5.1)
Sodium: 139 mmol/L (ref 135–145)

## 2023-06-16 LAB — CBC
HCT: 42.7 % (ref 36.0–46.0)
Hemoglobin: 13.8 g/dL (ref 12.0–15.0)
MCH: 31 pg (ref 26.0–34.0)
MCHC: 32.3 g/dL (ref 30.0–36.0)
MCV: 96 fL (ref 80.0–100.0)
Platelets: 191 10*3/uL (ref 150–400)
RBC: 4.45 MIL/uL (ref 3.87–5.11)
RDW: 12.6 % (ref 11.5–15.5)
WBC: 7.8 10*3/uL (ref 4.0–10.5)
nRBC: 0 % (ref 0.0–0.2)

## 2023-06-16 LAB — WET PREP, GENITAL
Clue Cells Wet Prep HPF POC: NONE SEEN
Sperm: NONE SEEN
Trich, Wet Prep: NONE SEEN
WBC, Wet Prep HPF POC: <10 (ref <10)
Yeast Wet Prep HPF POC: NONE SEEN

## 2023-06-16 MED ORDER — SODIUM CHLORIDE 0.9 % IV SOLN
1.0000 g | Freq: Once | INTRAVENOUS | Status: AC
  Administered 2023-06-16: 1 g via INTRAVENOUS
  Filled 2023-06-16: qty 10

## 2023-06-16 MED ORDER — KETOROLAC TROMETHAMINE 15 MG/ML IJ SOLN
15.0000 mg | Freq: Once | INTRAMUSCULAR | Status: AC
  Administered 2023-06-16: 15 mg via INTRAVENOUS
  Filled 2023-06-16: qty 1

## 2023-06-16 MED ORDER — CEPHALEXIN 500 MG PO CAPS: 500.0000 mg | ORAL_CAPSULE | Freq: Three times a day (TID) | ORAL | 0 refills | Status: AC

## 2023-06-16 MED ORDER — PHENAZOPYRIDINE HCL 100 MG PO TABS
200.0000 mg | ORAL_TABLET | Freq: Once | ORAL | Status: DC
  Filled 2023-06-16: qty 2

## 2023-06-16 NOTE — ED Provider Notes (Signed)
[place] EMERGENCY DEPARTMENT AT [facility] Provider Note   CSN: [id number] Arrival date & time: 06/16/23  1849     History  Chief Complaint  Patient presents with   Pelvic Pain    [name] is a 63 y.o. female.   Pelvic Pain     Has a history of arthritis, chronic kidney disease, interstitial cystitis, acid reflux.  Patient presents to the ED with complaints of pelvic pain and dysuria.  Patient states she started having symptoms last night.  She constantly has the urge to urinate.  She feels a burning discomfort in her pelvic region that increases with urination.  She denies any vaginal discharge.  No back pain.  No fevers  Home Medications Prior to Admission medications   Medication Sig Start Date End Date Taking? Authorizing Provider  cephALEXin (KEFLEX) 500 MG capsule Take 1 capsule (500 mg total) by mouth 3 (three) times daily for 5 days. 06/16/23 06/21/23 Yes Linwood Dibbles, MD  acetaminophen (TYLENOL) 500 MG tablet Take 2 tablets (1,000 mg total) by mouth every 6 (six) hours as needed for mild pain. 08/31/18   Juliet Rude, PA-C  acidophilus (RISAQUAD) CAPS capsule Take 1 capsule by mouth daily.    [provider]  ALPHA-LIPOIC ACID PO Take 1 tablet by mouth daily.    [provider]  amitriptyline (ELAVIL) 50 MG tablet Take 50 mg by mouth at bedtime. 07/27/18   [provider]  CALCIUM-VITAMIN D PO Take 1 tablet by mouth daily.    [provider]  Cholecalciferol (VITAMIN D3 PO) Take 3 tablets by mouth daily.    [provider]  clonazePAM (KLONOPIN) 0.5 MG tablet Take 0.5 mg by mouth 2 (two) times daily.    [provider]  ELMIRON 100 MG capsule Take 100 mg by mouth daily. 07/27/18   [provider]  gabapentin (NEURONTIN) 300 MG capsule Take 900-1,200 mg by mouth See admin instructions. Take 1200 mg by mouth in the morning and 900 mg in the afternoon and 900 mg at bedtime 08/09/18   [provider]  HYDROcodone-acetaminophen (NORCO/VICODIN) 5-325 MG tablet Take 1-2 tablets by mouth every 4 (four) hours as needed for moderate pain or severe pain. 12/11/20   Tressie Stalker, MD  hydroxypropyl methylcellulose / hypromellose (ISOPTO TEARS / GONIOVISC) 2.5 % ophthalmic solution Place 1 drop into both eyes 3 (three) times daily as needed for dry eyes.    [provider]  MAGNESIUM PO Take 1 tablet by mouth daily.    [provider]  pilocarpine (SALAGEN) 5 MG tablet Take 5 mg by mouth 3 (three) times daily. 10/06/20   [provider]  polycarbophil (FIBERCON) 625 MG tablet Take 625 mg by mouth daily.    [provider]  tiZANidine (ZANAFLEX) 4 MG tablet Take 1 tablet (4 mg total) by mouth every 6 (six) hours as needed for muscle spasms. 12/11/20   Tressie Stalker, MD  venlafaxine XR (EFFEXOR-XR) 150 MG 24 hr capsule Take 150 mg by mouth daily with breakfast.    [provider]  vitamin C (ASCORBIC ACID) 500 MG tablet Take 500 mg by mouth daily.    [provider]  YUVAFEM 10 MCG TABS vaginal tablet Place 10 mcg vaginally 2 (two) times a week. 09/22/20   [provider]  ZINC GLUCONATE PO Take 2 tablets by mouth daily.    [provider]      Allergies    Betadine antibiotic-moisturize [  bacitracin-polymyxin b]    Review of Systems   Review of Systems  Genitourinary:  Positive for pelvic pain.    Physical Exam Updated Vital Signs BP (!) 139/103 (BP Location: Left Arm)   Pulse 86   Temp 98.1 F (36.7 C) (Oral)   Resp 17   Ht 1.702 m (5\' 7" )   Wt 91.6 kg   LMP  (LMP Unknown)   SpO2 100%   BMI 31.64 kg/m  Physical Exam Vitals and nursing note reviewed.  Constitutional:      General: She is not in acute distress.    Appearance: She is well-developed.  HENT:     Head: Normocephalic and atraumatic.     Right Ear: External ear normal.     Left Ear: External ear normal.  Eyes:     General: No  scleral icterus.       Right eye: No discharge.        Left eye: No discharge.     Conjunctiva/sclera: Conjunctivae normal.  Neck:     Trachea: No tracheal deviation.  Cardiovascular:     Rate and Rhythm: Normal rate and regular rhythm.  Pulmonary:     Effort: Pulmonary effort is normal. No respiratory distress.     Breath sounds: Normal breath sounds. No stridor. No wheezing or rales.  Abdominal:     General: Bowel sounds are normal. There is no distension.     Palpations: Abdomen is soft.     Tenderness: There is abdominal tenderness. There is no guarding or rebound.     Comments: Tenderness palpation suprapubic region  Musculoskeletal:        General: No tenderness or deformity.     Cervical back: Neck supple.  Skin:    General: Skin is warm and dry.     Findings: No rash.  Neurological:     General: No focal deficit present.     Mental Status: She is alert.     Cranial Nerves: No cranial nerve deficit, dysarthria or facial asymmetry.     Sensory: No sensory deficit.     Motor: No abnormal muscle tone or seizure activity.     Coordination: Coordination normal.  Psychiatric:        Mood and Affect: Mood normal.     ED Results / Procedures / Treatments   Labs (all labs ordered are listed, but only abnormal results are displayed) Labs Reviewed  BASIC METABOLIC PANEL - Abnormal; Notable for the following components:      Result Value   Glucose, Bld 101 (*)    Creatinine, Ser 1.21 (*)    GFR, Estimated 51 (*)    All other components within normal limits  URINALYSIS, W/ REFLEX TO CULTURE (INFECTION SUSPECTED) - Abnormal; Notable for the following components:   Color, Urine COLORLESS (*)    Specific Gravity, Urine <1.005 (*)    Hgb urine dipstick MODERATE (*)    Leukocytes,Ua LARGE (*)    All other components within normal limits  WET PREP, GENITAL  URINE CULTURE  CBC    EKG None  Radiology CT Renal Stone Study Result Date: 06/16/2023 CLINICAL DATA:  Dysuria  and pelvic pain. EXAM: CT ABDOMEN AND PELVIS WITHOUT CONTRAST TECHNIQUE: Multidetector CT imaging of the abdomen and pelvis was performed following the standard protocol without IV contrast. RADIATION DOSE REDUCTION: This exam was performed according to the departmental dose-optimization program which includes automated exposure control, adjustment of the mA and/or kV according to patient size and/or use  of iterative reconstruction technique. COMPARISON:  CT 03/14/2023 FINDINGS: Lower chest: Mild subsegmental atelectasis in both lower lobes. Hepatobiliary: Multiple hepatic cysts, stable in appearance from prior. No further follow-up imaging is recommended. Gallbladder physiologically distended, no calcified stone. No biliary dilatation. Pancreas: No ductal dilatation or inflammation. Spleen: Normal in size without focal abnormality. Adrenals/Urinary Tract: Normal adrenal glands. No hydronephrosis. No renal calculi. No evidence of renal inflammation. Decompressed ureters. Nondistended urinary bladder, thick walled. Stomach/Bowel: Nondistended stomach. No bowel obstruction or inflammation. The appendix is not definitively seen. Colonic diverticulosis without diverticulitis. Vascular/Lymphatic: Minimal aortic atherosclerosis. No aortic aneurysm. No abdominopelvic adenopathy Reproductive: Status post hysterectomy. No adnexal masses. Other: Tiny fat containing umbilical hernia.  No ascites. Musculoskeletal: Scattered degenerative change in the spine with Schmorl's nodes. There are no acute or suspicious osseous abnormalities. IMPRESSION: 1. No renal calculi or obstructive uropathy. Nondistended urinary bladder, thick walled. Recommend correlation with urinalysis to assess for urinary tract infection. 2. Colonic diverticulosis without diverticulitis. Aortic Atherosclerosis (ICD10-I70.0). Electronically Signed   By: Narda Rutherford M.D.   On: 06/16/2023 20:51    Procedures Procedures    Medications Ordered in  ED Medications  phenazopyridine (PYRIDIUM) tablet 200 mg (200 mg Oral Not Given 06/16/23 2016)  ketorolac (TORADOL) 15 MG/ML injection 15 mg (15 mg Intravenous Given 06/16/23 1956)  cefTRIAXone (ROCEPHIN) 1 g in sodium chloride 0.9 % 100 mL IVPB (1 g Intravenous New Bag/Given 06/16/23 2015)    ED Course/ Medical Decision Making/ A&P Clinical Course as of 06/16/23 2120  Thu Jun 16, 2023  1957 Urinalysis shows large leukocyte esterase greater than 50 white blood cells white blood cell clumps [JK]  2018 CBC CBC normal [JK]  2040 Wet prep negative [JK]  2109 CT scan does not show any signs of kidney stones [JK]  2113 Wet prep without clue cells trichomonas or yeast [JK]    Clinical Course User Index [JK] Linwood Dibbles, MD                                 Medical Decision Making Differential diagnosis but not limited to UTI, pyelonephritis, diverticulitis ureterolithiasis  Problems Addressed: Acute cystitis without hematuria: acute illness or injury that poses a threat to life or bodily functions  Amount and/or Complexity of Data Reviewed Labs: ordered. Decision-making details documented in ED Course. Radiology: ordered and independent interpretation performed.  Risk Prescription drug management.   Patient presented to the ED with complaint of pelvic pain and burning with urination.  Patient states the pain and discomfort was intense.  She was concerned about the possibility of kidney stone as well.  Initial laboratory test showed normal CBC and metabolic panel urinalysis was suggestive of a UTI.  Discussed this with the patient.  She was concerned however about a possible kidney stone as well concerning her degree of pain and discomfort.  CT scan was performed and does not show any acute abnormality.  Patient was given a dose of antibiotics.  Will DC home on oral antibiotics.        Final Clinical Impression(s) / ED Diagnoses Final diagnoses:  Acute cystitis without hematuria     Rx / DC Orders ED Discharge Orders          Ordered    cephALEXin (KEFLEX) 500 MG capsule  3 times daily        06/16/23 2114  Linwood Dibbles, MD 06/16/23 2121

## 2023-06-16 NOTE — Discharge Instructions (Signed)
Take the antibiotics as prescribed to treat your urinary tract infection.  We sent off a culture today to test for the specific bacteria and the antibiotic sensitivity.  We will call you if there is any antibiotic changes that are needed.  Follow-up with your primary care doctor to be rechecked return to the ED for fevers or other concerning symptoms

## 2023-06-16 NOTE — ED Triage Notes (Signed)
Pt reports dysuria and pelvic pain that began last night. Pt reports hx of vaginal infections and kidney stones.

## 2023-06-18 LAB — URINE CULTURE: Culture: >=100000 — AB

## 2023-06-19 ENCOUNTER — Telehealth (HOSPITAL_BASED_OUTPATIENT_CLINIC_OR_DEPARTMENT_OTHER): Payer: Self-pay | Admitting: *Deleted

## 2023-06-19 NOTE — Progress Notes (Signed)
 ED Antimicrobial Stewardship Positive Culture Follow Up   [name] is an 63 y.o. female who presented to [facility] on 06/16/2023 with a chief complaint of  Chief Complaint  Patient presents with   Pelvic Pain    Recent Results (from the past 720 hours)  Urine Culture     Status: Abnormal   Collection Time: 06/16/23  7:28 PM   Specimen: Urine, Random  Result Value Ref Range Status   Specimen Description   Final    URINE, RANDOM Performed at [facility], [address], [place], [place] [address]    Special Requests   Final    NONE Reflexed from [id number] Performed at [facility], [address] [place], [place] [address]    Culture >=100,000 COLONIES/mL KLEBSIELLA AEROGENES (A)  Final   Report Status 06/18/2023 FINAL  Final   Organism ID, Bacteria KLEBSIELLA AEROGENES (A)  Final      Susceptibility   Klebsiella aerogenes - MIC*    CEFEPIME <=0.12 SENSITIVE Sensitive     CEFTRIAXONE <=0.25 SENSITIVE Sensitive     CIPROFLOXACIN <=0.25 SENSITIVE Sensitive     GENTAMICIN <=1 SENSITIVE Sensitive     IMIPENEM 2 SENSITIVE Sensitive     NITROFURANTOIN 64 INTERMEDIATE Intermediate     TRIMETH/SULFA <=20 SENSITIVE Sensitive     PIP/TAZO <=4 SENSITIVE Sensitive ug/mL    * >=100,000 COLONIES/mL KLEBSIELLA AEROGENES  Wet prep, genital     Status: None   Collection Time: 06/16/23  8:05 PM  Result Value Ref Range Status   Yeast Wet Prep HPF POC NONE SEEN NONE SEEN Final   Trich, Wet Prep NONE SEEN NONE SEEN Final   Clue Cells Wet Prep HPF POC NONE SEEN NONE SEEN Final   WBC, Wet Prep HPF POC <10 <10 Final   Sperm NONE SEEN  Final    Comment: Performed at [facility], [address] [place], [place] [address]    [x]  Treated with Keflex, organism resistant to prescribed antimicrobial []  Patient discharged originally without antimicrobial agent and treatment is now indicated  New antibiotic prescription:  Cefpodoxime  ED Provider: Maryanna Shape, PA-C   [name] 06/19/2023, 10:58 AM Clinical Pharmacist Monday - Friday phone -  [phone] Saturday - Sunday phone - [phone]

## 2023-06-19 NOTE — Telephone Encounter (Signed)
 Post ED Visit - Positive Culture Follow-up: Successful Patient Follow-Up  Culture assessed and recommendations reviewed by:  [x]  [name], Pharm.D. []  Celedonio Miyamoto, Pharm.D., BCPS AQ-ID []  Garvin Fila, Pharm.D., BCPS []  Georgina Pillion, [address].D., BCPS []  [place], [address].D., BCPS, AAHIVP []  Estella Husk, Pharm.D., BCPS, AAHIVP []  Lysle Pearl, PharmD, BCPS []  Phillips Climes, PharmD, BCPS []  Agapito Games, PharmD, BCPS []  Verlan Friends, PharmD  Positive urine culture  []  Patient discharged without antimicrobial prescription and treatment is now indicated [x]  Organism is resistant to prescribed ED discharge antimicrobial []  Patient with positive blood cultures  Changes discussed with ED provider: Maryanna Shape, PA-C New antibiotic prescription Cefpodozime 100mg  BID x 5 days Called to [facility], [place], [place]  Contacted patient, date 06/19/23, time 117   Patsey Berthold 06/19/2023, 11:18 AM

## 2023-07-05 ENCOUNTER — Telehealth: Payer: Self-pay | Admitting: Internal Medicine

## 2023-07-05 NOTE — Telephone Encounter (Signed)
 Good morning Dr. Rhea Belton,    Patients husband call our practice stating he wanted to make an appointment for patient to be seen with you. Patient does have recent history with [facility] in December of 2024. Patients husband is requesting transfer of care to you because you are his provider and because they feel as if she is not getting the correct treatment. Patient is needing to be seen for issues with Silent reflux. Patients records are in epic, well you please review and advise on scheduling patient?   Thank you.

## 2023-07-06 NOTE — Telephone Encounter (Signed)
Request received to transfer GI care from outside practice to [facility].  We appreciate the interest in our practice, however at this time due to high demand from patients without established GI providers we cannot accommodate this transfer.      

## 2023-07-14 NOTE — Telephone Encounter (Signed)
 Left detailed message in regards to previous recommendations.

## 2024-03-12 ENCOUNTER — Other Ambulatory Visit: Payer: Self-pay | Admitting: Orthopedic Surgery

## 2024-03-12 DIAGNOSIS — M791 Myalgia, unspecified site: Secondary | ICD-10-CM

## 2024-03-12 DIAGNOSIS — M542 Cervicalgia: Secondary | ICD-10-CM

## 2024-03-12 DIAGNOSIS — M47812 Spondylosis without myelopathy or radiculopathy, cervical region: Secondary | ICD-10-CM

## 2024-04-07 ENCOUNTER — Other Ambulatory Visit

## 2024-04-16 ENCOUNTER — Other Ambulatory Visit

## 2024-04-30 ENCOUNTER — Encounter: Payer: Self-pay | Admitting: Orthopedic Surgery

## 2024-04-30 NOTE — Telephone Encounter (Signed)
 [name] I believe chris added her on for 1015 but not sure if she can make that time? JMW

## 2024-05-01 ENCOUNTER — Ambulatory Visit
Admission: RE | Admit: 2024-05-01 | Discharge: 2024-05-01 | Disposition: A | Source: Ambulatory Visit | Attending: Orthopedic Surgery | Admitting: Orthopedic Surgery

## 2024-05-01 DIAGNOSIS — M47812 Spondylosis without myelopathy or radiculopathy, cervical region: Secondary | ICD-10-CM

## 2024-05-01 DIAGNOSIS — M542 Cervicalgia: Secondary | ICD-10-CM

## 2024-05-01 DIAGNOSIS — M791 Myalgia, unspecified site: Secondary | ICD-10-CM
